# Patient Record
Sex: Male | Born: 1960 | Race: White | Hispanic: No | Marital: Single | Smoking: Never smoker
Health system: Southern US, Community
[De-identification: ages and names within clinical notes are randomized; demographics above are authoritative.]

## PROBLEM LIST (undated history)

## (undated) DIAGNOSIS — E119 Type 2 diabetes mellitus without complications: Secondary | ICD-10-CM

## (undated) DIAGNOSIS — I252 Old myocardial infarction: Secondary | ICD-10-CM

## (undated) DIAGNOSIS — I10 Essential (primary) hypertension: Secondary | ICD-10-CM

## (undated) DIAGNOSIS — M1990 Unspecified osteoarthritis, unspecified site: Secondary | ICD-10-CM

## (undated) DIAGNOSIS — M109 Gout, unspecified: Secondary | ICD-10-CM

## (undated) DIAGNOSIS — E039 Hypothyroidism, unspecified: Secondary | ICD-10-CM

## (undated) DIAGNOSIS — I4891 Unspecified atrial fibrillation: Secondary | ICD-10-CM

## (undated) DIAGNOSIS — E079 Disorder of thyroid, unspecified: Secondary | ICD-10-CM

## (undated) HISTORY — PX: CARDIAC CATHETERIZATION: SHX172

## (undated) HISTORY — PX: BACK SURGERY: SHX140

## (undated) HISTORY — PX: CATARACT EXTRACTION W/ INTRAOCULAR LENS IMPLANT: SHX1309

## (undated) HISTORY — PX: CORONARY ARTERY BYPASS GRAFT: SHX141

---

## 2010-07-04 ENCOUNTER — Ambulatory Visit: Payer: Self-pay | Admitting: Cardiovascular Disease

## 2010-07-04 ENCOUNTER — Observation Stay: Payer: Self-pay | Admitting: Internal Medicine

## 2011-09-30 ENCOUNTER — Inpatient Hospital Stay: Payer: Self-pay | Admitting: Internal Medicine

## 2014-03-05 ENCOUNTER — Ambulatory Visit: Payer: Self-pay | Admitting: Gastroenterology

## 2014-03-13 ENCOUNTER — Emergency Department: Payer: Self-pay | Admitting: Internal Medicine

## 2014-03-13 LAB — CBC
HCT: 42.6 %
HGB: 14.3 g/dL
MCH: 30.7 pg
MCHC: 33.5 g/dL
MCV: 92 fL
Platelet: 176 x10 3/mm 3
RBC: 4.65 x10 6/mm 3
RDW: 14.1 %
WBC: 6.1 x10 3/mm 3

## 2014-03-13 LAB — COMPREHENSIVE METABOLIC PANEL
AST: 59 U/L — AB (ref 15–37)
Albumin: 3.7 g/dL (ref 3.4–5.0)
Alkaline Phosphatase: 65 U/L
Anion Gap: 4 — ABNORMAL LOW (ref 7–16)
BILIRUBIN TOTAL: 0.4 mg/dL (ref 0.2–1.0)
BUN: 19 mg/dL — AB (ref 7–18)
CHLORIDE: 103 mmol/L (ref 98–107)
CREATININE: 0.94 mg/dL (ref 0.60–1.30)
Calcium, Total: 8.8 mg/dL (ref 8.5–10.1)
Co2: 30 mmol/L (ref 21–32)
EGFR (African American): >60
EGFR (Non-African Amer.): >60
GLUCOSE: 145 mg/dL — AB (ref 65–99)
Osmolality: 279 (ref 275–301)
Potassium: 3.7 mmol/L (ref 3.5–5.1)
SGPT (ALT): 64 U/L (ref 12–78)
Sodium: 137 mmol/L (ref 136–145)
TOTAL PROTEIN: 6.8 g/dL (ref 6.4–8.2)

## 2014-09-20 ENCOUNTER — Observation Stay: Payer: Self-pay | Admitting: Internal Medicine

## 2014-09-20 LAB — CBC WITH DIFFERENTIAL/PLATELET
BASOS ABS: 0.1 10*3/uL (ref 0.0–0.1)
BASOS PCT: 0.6 %
Eosinophil #: 0 10*3/uL (ref 0.0–0.7)
Eosinophil %: 0.3 %
HCT: 40.9 % (ref 40.0–52.0)
HGB: 13.7 g/dL (ref 13.0–18.0)
LYMPHS ABS: 0.9 10*3/uL — AB (ref 1.0–3.6)
Lymphocyte %: 10.8 %
MCH: 31.3 pg (ref 26.0–34.0)
MCHC: 33.5 g/dL (ref 32.0–36.0)
MCV: 94 fL (ref 80–100)
MONO ABS: 0.5 x10 3/mm (ref 0.2–1.0)
MONOS PCT: 6 %
Neutrophil #: 6.7 10*3/uL — ABNORMAL HIGH (ref 1.4–6.5)
Neutrophil %: 82.3 %
Platelet: 166 10*3/uL (ref 150–440)
RBC: 4.37 10*6/uL — AB (ref 4.40–5.90)
RDW: 13.3 % (ref 11.5–14.5)
WBC: 8.1 10*3/uL (ref 3.8–10.6)

## 2014-09-20 LAB — LIPASE, BLOOD: LIPASE: 177 U/L (ref 73–393)

## 2014-09-20 LAB — COMPREHENSIVE METABOLIC PANEL
ALK PHOS: 63 U/L
ALT: 99 U/L — AB
ANION GAP: 10 (ref 7–16)
AST: 93 U/L — AB (ref 15–37)
Albumin: 3.5 g/dL (ref 3.4–5.0)
BILIRUBIN TOTAL: 0.3 mg/dL (ref 0.2–1.0)
BUN: 17 mg/dL (ref 7–18)
CALCIUM: 8.7 mg/dL (ref 8.5–10.1)
Chloride: 103 mmol/L (ref 98–107)
Co2: 25 mmol/L (ref 21–32)
Creatinine: 1.21 mg/dL (ref 0.60–1.30)
EGFR (African American): >60
GLUCOSE: 311 mg/dL — AB (ref 65–99)
Osmolality: 289 (ref 275–301)
Potassium: 4.2 mmol/L (ref 3.5–5.1)
SODIUM: 138 mmol/L (ref 136–145)
Total Protein: 7 g/dL (ref 6.4–8.2)

## 2014-09-20 LAB — TROPONIN I
TROPONIN-I: 0.07 ng/mL — AB
TROPONIN-I: 0.08 ng/mL — AB
TROPONIN-I: 0.09 ng/mL — AB

## 2014-09-20 LAB — MAGNESIUM: MAGNESIUM: 1.3 mg/dL — AB

## 2014-09-20 LAB — PROTIME-INR
INR: 1
PROTHROMBIN TIME: 13 s (ref 11.5–14.7)

## 2014-09-20 LAB — APTT: Activated PTT: 28.8 secs (ref 23.6–35.9)

## 2014-09-21 LAB — URINALYSIS, COMPLETE
BACTERIA: NONE SEEN
Bilirubin,UR: NEGATIVE
Blood: NEGATIVE
Ketone: NEGATIVE
Leukocyte Esterase: NEGATIVE
Nitrite: NEGATIVE
PH: 5 (ref 4.5–8.0)
RBC,UR: <1 /HPF (ref 0–5)
SQUAMOUS EPITHELIAL: NONE SEEN
Specific Gravity: 1.022 (ref 1.003–1.030)
WBC UR: <1 /HPF (ref 0–5)

## 2014-09-21 LAB — COMPREHENSIVE METABOLIC PANEL
ALT: 81 U/L — AB
Albumin: 3.2 g/dL — ABNORMAL LOW (ref 3.4–5.0)
Alkaline Phosphatase: 52 U/L
Anion Gap: 9 (ref 7–16)
BILIRUBIN TOTAL: 0.2 mg/dL (ref 0.2–1.0)
BUN: 14 mg/dL (ref 7–18)
CALCIUM: 8.3 mg/dL — AB (ref 8.5–10.1)
CREATININE: 1.06 mg/dL (ref 0.60–1.30)
Chloride: 102 mmol/L (ref 98–107)
Co2: 28 mmol/L (ref 21–32)
EGFR (Non-African Amer.): >60
Glucose: 214 mg/dL — ABNORMAL HIGH (ref 65–99)
Osmolality: 284 (ref 275–301)
Potassium: 3.5 mmol/L (ref 3.5–5.1)
SGOT(AST): 64 U/L — ABNORMAL HIGH (ref 15–37)
SODIUM: 139 mmol/L (ref 136–145)
Total Protein: 5.9 g/dL — ABNORMAL LOW (ref 6.4–8.2)

## 2014-09-21 LAB — LIPID PANEL
CHOLESTEROL: 145 mg/dL (ref 0–200)
HDL Cholesterol: 26 mg/dL — ABNORMAL LOW (ref 40–60)
LDL CHOLESTEROL, CALC: 64 mg/dL (ref 0–100)
Triglycerides: 277 mg/dL — ABNORMAL HIGH (ref 0–200)
VLDL Cholesterol, Calc: 55 mg/dL — ABNORMAL HIGH (ref 5–40)

## 2014-09-21 LAB — TSH: Thyroid Stimulating Horm: 43 u[IU]/mL — ABNORMAL HIGH

## 2014-09-21 LAB — T4, FREE: Free Thyroxine: 0.85 ng/dL (ref 0.76–1.46)

## 2014-09-21 LAB — HEPARIN LEVEL (UNFRACTIONATED)
Anti-Xa(Unfractionated): 0.17 IU/mL — ABNORMAL LOW (ref 0.30–0.70)
Anti-Xa(Unfractionated): 0.38 IU/mL (ref 0.30–0.70)

## 2014-09-21 LAB — HEMOGLOBIN A1C: HEMOGLOBIN A1C: 9.5 % — AB (ref 4.2–6.3)

## 2014-09-21 LAB — MAGNESIUM: Magnesium: 1.3 mg/dL — ABNORMAL LOW

## 2015-01-30 ENCOUNTER — Emergency Department: Payer: Self-pay | Admitting: Emergency Medicine

## 2015-02-26 ENCOUNTER — Emergency Department
Admit: 2015-02-26 | Disposition: A | Discharge status: Home / Self Care | Payer: Self-pay | Admitting: Emergency Medicine

## 2015-02-26 LAB — CBC
HCT: 39.3 % — ABNORMAL LOW (ref 40.0–52.0)
HGB: 13.3 g/dL (ref 13.0–18.0)
MCH: 31.2 pg (ref 26.0–34.0)
MCHC: 33.7 g/dL (ref 32.0–36.0)
MCV: 93 fL (ref 80–100)
PLATELETS: 170 10*3/uL (ref 150–440)
RBC: 4.24 10*6/uL — AB (ref 4.40–5.90)
RDW: 13.3 % (ref 11.5–14.5)
WBC: 8.3 10*3/uL (ref 3.8–10.6)

## 2015-02-26 LAB — BASIC METABOLIC PANEL
Anion Gap: 9 (ref 7–16)
BUN: 33 mg/dL — ABNORMAL HIGH
CHLORIDE: 99 mmol/L — AB
Calcium, Total: 9 mg/dL
Co2: 26 mmol/L
Creatinine: 1.26 mg/dL — ABNORMAL HIGH
EGFR (African American): >60
EGFR (Non-African Amer.): >60
GLUCOSE: 314 mg/dL — AB
Potassium: 3.9 mmol/L
SODIUM: 134 mmol/L — AB

## 2015-02-26 LAB — TROPONIN I: TROPONIN-I: 0.07 ng/mL — AB

## 2015-02-26 LAB — T4, FREE: Free Thyroxine: 1.02 ng/dL

## 2015-02-27 LAB — TROPONIN I: Troponin-I: 0.06 ng/mL — ABNORMAL HIGH

## 2015-03-18 NOTE — Consult Note (Signed)
PATIENT NAME:  Ronald Whitaker, Kyce A MR#:  [id number] DATE OF BIRTH:  01-19-1961  DATE OF CONSULTATION:  03/05/2014  CONSULTING PHYSICIAN:  Dow AdolphMatthew Deitrich Steve, MD  REFERRING PHYSICIAN:  [facility]  REASON FOR CONSULT: Food impaction in esophagus.  HISTORY OF PRESENT ILLNESS: Ronald Whitaker is a 54 year old male with a past medical history notable for coronary artery disease status post CABG, TIA, hypothyroidism, gout, GERD, who presented to the Emergency Room for a little less than 24 hours of a piece of pork being stuck in his esophagus. He reports that he has had several food impactions in the past, but they all resolved on their own. He has never had an upper endoscopy before. Currently he is unable to swallow any liquids or solids, and is having a hard time with his saliva. He has the sensation that the pork has been stuck there since 7:30 last night.   Other than this, he is not having any trouble such as weight loss, diarrhea, rectal bleeding, black bowel movements. He reports that there is a family history of esophageal food impactions. He is currently taking Prilosec, which is controlling his acid reflux well.   PAST MEDICAL HISTORY: 1.  Coronary artery disease, status post CABG.  2.  Hypothyroid.  3.  Hyperlipidemia.  4.  TIA.   5.  GERD.  6.  Gout.  7.  Diverticulitis.  8.  Rhabdomyolysis.   MEDICATIONS: Include Prilosec, aspirin 81, lisinopril. He does not recall his other medicines.   FAMILY HISTORY: No family history of GI malignancy, esophageal cancer, stomach cancer, colon cancer.   SOCIAL HISTORY: He denies any tobacco or any alcohol.  REVIEW OF SYSTEMS: A 10-system review is conducted and is negative, except as stated in the HPI.    PHYSICAL EXAMINATION: GENERAL: Alert and oriented times 4.  No acute distress. Appears stated age. HEENT: Normocephalic/atraumatic. Extraocular movements are intact. Anicteric. NECK: Soft, supple. JVP appears normal. No  adenopathy. CHEST: Clear to auscultation. No wheeze or crackle. Respirations unlabored. HEART: Regular. No murmur, rub, or gallop.  Normal S1 and S2. ABDOMEN: Soft, nontender, nondistended.  Normal active bowel sounds in all four quadrants.  No organomegaly. No masses EXTREMITIES: No swelling, well perfused. SKIN: No rash or lesion. Skin color, texture, turgor normal. NEUROLOGICAL: Grossly intact. PSYCHIATRIC: Normal tone and affect. MUSCULOSKELETAL: No joint swelling or erythema.   ASSESSMENT AND PLAN: Esophageal food bolus: It does sound as though his symptoms are consistent with an ongoing esophageal food bolus. Therefore, it will be necessary to perform an upper endoscopy to prevent necrosis of the esophagus and also for symptomatic relief.   RECOMMENDATIONS: We will perform upper endoscopy at this time with Anesthesia assistance with intubation for food bolus removal. Further recommendations will be pending findings on upper endoscopy. He likely will need a repeat upper endoscopy in several weeks for investigation of the cause and with potential biopsies versus treatment. In addition, I will likely recommend increase in his Prilosec to 40 mg b.i.d., but that will be pending the findings on upper endoscopy.    Thank you for this consult.    ____________________________ Dow AdolphMatthew Latayvia Mandujano, MD mr:mr D: 03/05/2014 18:01:34 ET T: 03/05/2014 18:11:12 ET JOB#: [id number]  cc: Dow AdolphMatthew Madysun Thall, MD, <Dictator> Kathalene FramesMATTHEW G Toniyah Dilmore MD ELECTRONICALLY SIGNED 03/22/2014 17:03

## 2015-03-18 NOTE — Discharge Summary (Signed)
PATIENT NAME:  Ronald Whitaker, Ronald Whitaker MR#:  [id number] DATE OF BIRTH:  02-11-1961  ADMITTING PHYSICIAN: Shaune PollackQing Chen, MD  DISCHARGING PHYSICIAN: Enid Baasadhika Gladie Gravette, MD  PRIMARY CARE PHYSICIAN: Nonlocal.   [facility].  CONSULTATIONS IN THE HOSPITAL  Cardiology consultation with Dwayne D. Callwood, MD.  DISCHARGE DIAGNOSES:  1.  New onset atrial fibrillation.  2.  Hypothyroidism. 3.  Coronary artery disease, status post bypass graft surgery. 4.  Hypertension.  5.  Gastroesophageal reflux disease. 6.  Diverticulosis. 7.  History of transient ischemic attack. 8.  Fatty liver. 9.  Undiagnosed diabetes mellitus.  DISCHARGE HOME MEDICATIONS: 1.  Benazepril 20 mg p.o. daily.  2.  Lipitor 40 mg p.o. at bedtime.  3.  Sublingual nitroglycerin q. 5 minutes p.r.n. for chest pain.  4.  Tylenol 325 mg 1 tablet q. 4-6 hours p.r.n. for pain.  5.  Prilosec 40 mg p.o. daily.  6.  Aspirin 81 mg p.o. daily.  7.  Multivitamin 1 tablet p.o. daily.  8.  Polyunsaturated fatty acids 1 gram capsule 3 capsules once Whitaker day.  9.  Cialis 5 mg daily p.r.n.  10.  Synthroid 100 mcg p.o. daily.  11.  Norvasc 5 mg p.o. daily.  12.  Metoprolol 50 mg p.o. b.i.d.  13.  Eliquis 5 mg p.o. b.i.d.    DISCHARGE HOME OXYGEN:  None.  DISCHARGE DIET: Low-sodium diet.   DISCHARGE ACTIVITY: As tolerated.  FOLLOWUP INSTRUCTIONS:   1.  Cardiology followup in 2 weeks.  2.  PCP followup in 1 week.  3.  TSH and T4 levels checked by PCP in 4-6 weeks.  LABORATORY DATA AND IMAGING STUDIES: Prior to discharge, LDL cholesterol 64, HDL 26, total cholesterol 045145, triglycerides 277,  magnesium 1.3, hemoglobin A1c 9.5. Sodium 139, potassium 3.5, chloride 102, bicarbonate 28, BUN 14, creatinine 1.06, glucose 214, calcium of 8.3.  Ultrasound of the abdomen showing fatty liver.   ALT 81, AST 64, alkaline phosphatase 52, total bilirubin 0.2, albumin of 3.2. Free T4 is 0.85. TSH was elevated at 43. WBC 8.1,  hemoglobin 13.7, hematocrit 40.9, platelet count 166,000.   Chest x-ray showing clear lungs. No acute cardiopulmonary disease. Urinalysis negative for any infection.   BRIEF HOSPITAL COURSE: Ronald Whitaker is Whitaker 54 year old gentleman with history of hypertension, hyperlipidemia, coronary artery disease, status post bypass graft surgery, who presents secondary to palpitations and noted to have new onset atrial fibrillation.  1.  New onset atrial fibrillation. No prior history other than transient atrial fibrillation after his bypass graft surgery. He is already on metoprolol. Dose has been increased to 50 mg b.i.d., was started on heparin drip.  CHADS score is 2.  Seen by cardiologist, Dr. Juliann Paresallwood, who recommended anticoagulation because the patient does not have any risk factors. He is being discharged on Eliquis at this point. He has converted back to normal sinus rhythm after admission and has remained in normal sinus rhythm. No indication of any antiarrhythmias  according to cardiology. 2.  Hypothyroidism. TSH is extremely high, T4 though is in normal limits, but Synthroid dose is being increased to 100 mcg due to patient's symptoms of hypothyroidism, with fatigue and cold intolerance.  3.  Coronary artery disease, status post surgery bypass graft surgery, stable at this time. [facility]. Continue her aspirin, Lipitor, benazepril, and metoprolol at this time.  4.  Hypertension, on amlodipine. Dose decreased to 5 mg,  on metoprolol and also on benazepril. 5.  His course has been  otherwise uneventful in the hospital.   DISCHARGE CONDITION: Stable.   DISCHARGE DISPOSITION: Home.   TIME SPENT ON DISCHARGE: 45 minutes.   ____________________________ Enid Baas, MD rk:LT D: 09/21/2014 13:15:28 ET T: 09/21/2014 21:30:03 ET JOB#: [id number]  cc: Enid Baas, MD, <Dictator> [facility] Enid Baas MD ELECTRONICALLY SIGNED 09/27/2014 10:55

## 2015-09-23 IMAGING — CR DG CHEST 1V PORT
1 series · 1 of 1 positions shown · non-contrast
Comparison: 01/30/2015

CLINICAL DATA: Atrial fibrillation.

EXAM:
PORTABLE CHEST - 1 VIEW

[ap]
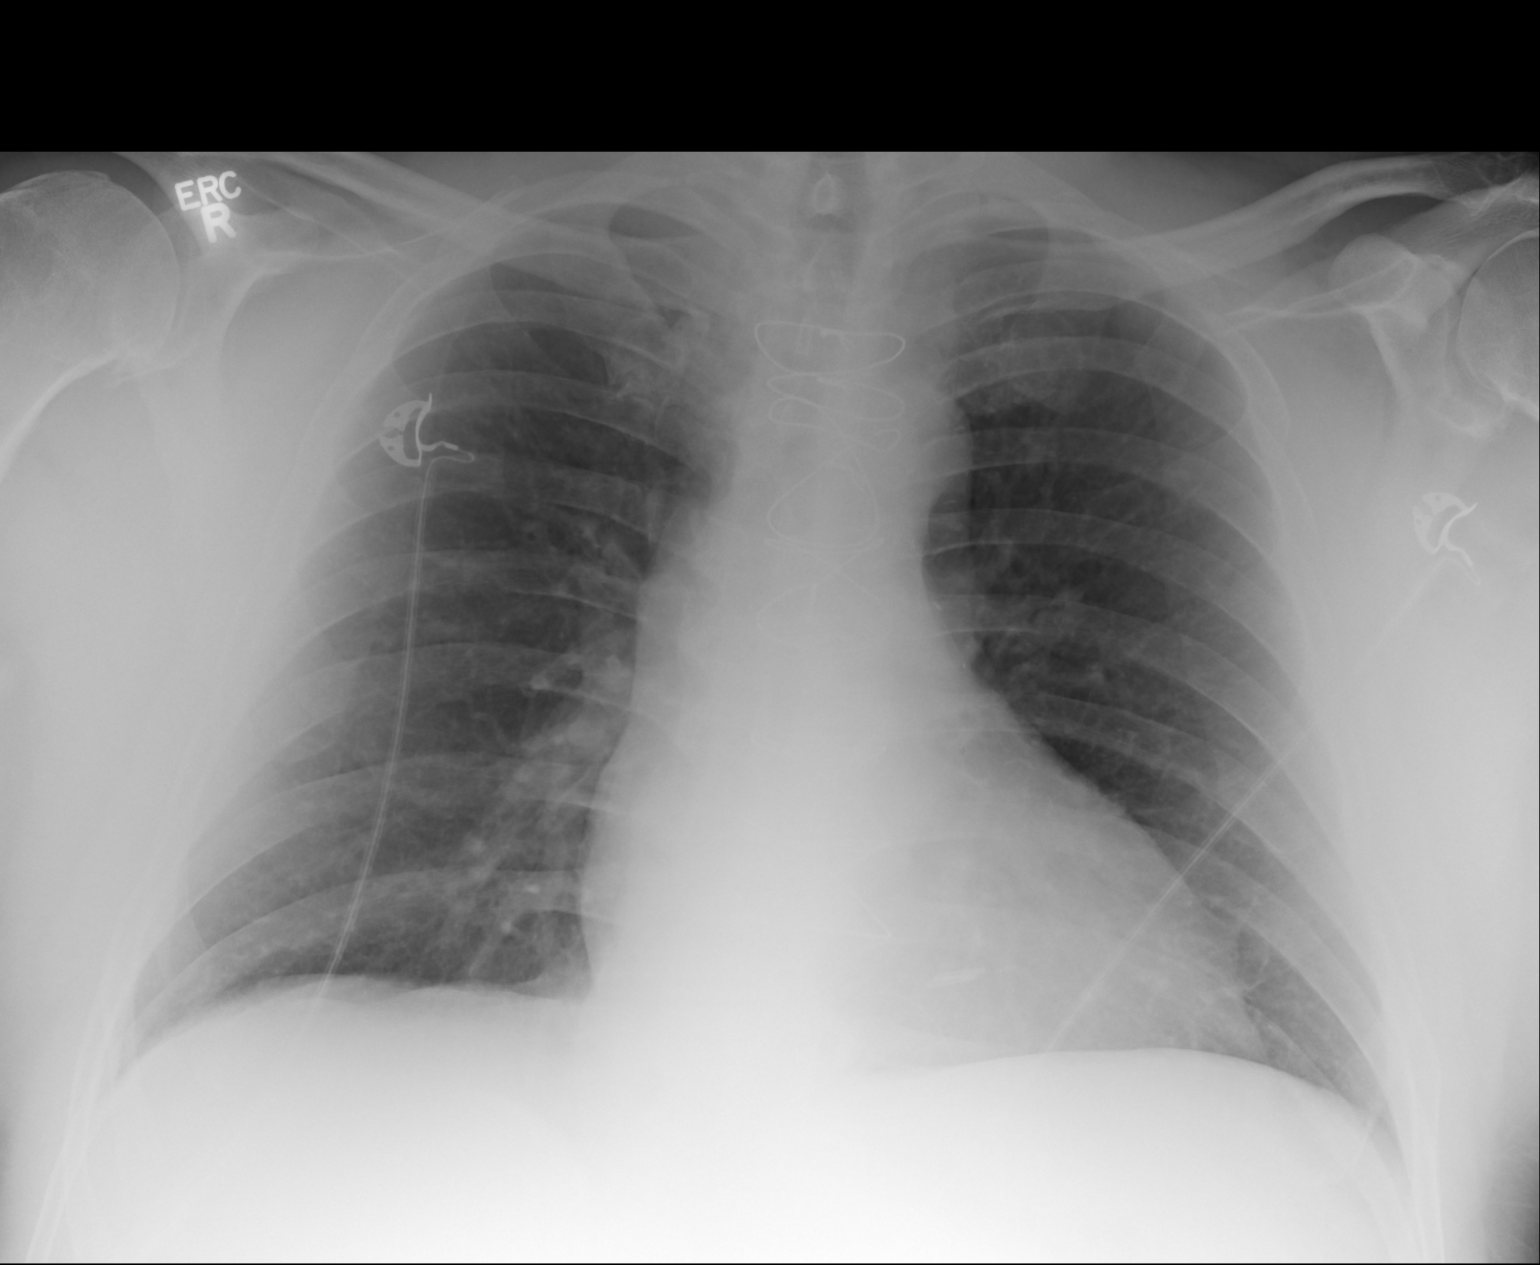

[1 of 1 positions shown; findings below may reference images not displayed]

FINDINGS: No cardiomegaly for technique. Noted previous sternotomy. Stable
moderate aortic tortuosity. The hila are negative.

Mild hyperinflation. There is no edema, consolidation, effusion, or
pneumothorax.
IMPRESSION: No active disease.

## 2015-12-18 ENCOUNTER — Encounter: Payer: Self-pay | Admitting: Emergency Medicine

## 2015-12-18 ENCOUNTER — Emergency Department
Admission: EM | Admit: 2015-12-18 | Discharge: 2015-12-18 | Disposition: A | Discharge status: Home / Self Care | Payer: BLUE CROSS/BLUE SHIELD | Attending: Emergency Medicine | Admitting: Emergency Medicine

## 2015-12-18 ENCOUNTER — Emergency Department: Payer: BLUE CROSS/BLUE SHIELD

## 2015-12-18 DIAGNOSIS — X58XXXA Exposure to other specified factors, initial encounter: Secondary | ICD-10-CM | POA: Diagnosis not present

## 2015-12-18 DIAGNOSIS — Y9389 Activity, other specified: Secondary | ICD-10-CM | POA: Diagnosis not present

## 2015-12-18 DIAGNOSIS — S86111A Strain of other muscle(s) and tendon(s) of posterior muscle group at lower leg level, right leg, initial encounter: Secondary | ICD-10-CM

## 2015-12-18 DIAGNOSIS — I10 Essential (primary) hypertension: Secondary | ICD-10-CM | POA: Insufficient documentation

## 2015-12-18 DIAGNOSIS — Y9289 Other specified places as the place of occurrence of the external cause: Secondary | ICD-10-CM | POA: Insufficient documentation

## 2015-12-18 DIAGNOSIS — S86811A Strain of other muscle(s) and tendon(s) at lower leg level, right leg, initial encounter: Secondary | ICD-10-CM | POA: Diagnosis not present

## 2015-12-18 DIAGNOSIS — M79661 Pain in right lower leg: Secondary | ICD-10-CM

## 2015-12-18 DIAGNOSIS — S8991XA Unspecified injury of right lower leg, initial encounter: Secondary | ICD-10-CM | POA: Diagnosis present

## 2015-12-18 DIAGNOSIS — Y998 Other external cause status: Secondary | ICD-10-CM | POA: Diagnosis not present

## 2015-12-18 HISTORY — DX: Disorder of thyroid, unspecified: E07.9

## 2015-12-18 HISTORY — DX: Gout, unspecified: M10.9

## 2015-12-18 HISTORY — DX: Unspecified atrial fibrillation: I48.91

## 2015-12-18 HISTORY — DX: Essential (primary) hypertension: I10
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
## 2015-12-18 NOTE — Discharge Instructions (Signed)

## 2016-02-07 ENCOUNTER — Emergency Department: Payer: BLUE CROSS/BLUE SHIELD

## 2016-02-07 ENCOUNTER — Emergency Department
Admission: EM | Admit: 2016-02-07 | Discharge: 2016-02-07 | Disposition: A | Discharge status: Home / Self Care | Payer: BLUE CROSS/BLUE SHIELD | Attending: Emergency Medicine | Admitting: Emergency Medicine

## 2016-02-07 DIAGNOSIS — K5790 Diverticulosis of intestine, part unspecified, without perforation or abscess without bleeding: Secondary | ICD-10-CM | POA: Diagnosis not present

## 2016-02-07 DIAGNOSIS — Z951 Presence of aortocoronary bypass graft: Secondary | ICD-10-CM | POA: Diagnosis not present

## 2016-02-07 DIAGNOSIS — Z79899 Other long term (current) drug therapy: Secondary | ICD-10-CM | POA: Diagnosis not present

## 2016-02-07 DIAGNOSIS — R109 Unspecified abdominal pain: Secondary | ICD-10-CM

## 2016-02-07 DIAGNOSIS — I482 Chronic atrial fibrillation: Secondary | ICD-10-CM | POA: Diagnosis not present

## 2016-02-07 DIAGNOSIS — R079 Chest pain, unspecified: Secondary | ICD-10-CM

## 2016-02-07 DIAGNOSIS — I10 Essential (primary) hypertension: Secondary | ICD-10-CM | POA: Insufficient documentation

## 2016-02-07 DIAGNOSIS — R1031 Right lower quadrant pain: Secondary | ICD-10-CM | POA: Insufficient documentation

## 2016-02-07 DIAGNOSIS — Z7984 Long term (current) use of oral hypoglycemic drugs: Secondary | ICD-10-CM | POA: Diagnosis not present

## 2016-02-07 DIAGNOSIS — Z7982 Long term (current) use of aspirin: Secondary | ICD-10-CM | POA: Insufficient documentation

## 2016-02-07 DIAGNOSIS — E119 Type 2 diabetes mellitus without complications: Secondary | ICD-10-CM | POA: Insufficient documentation

## 2016-02-07 HISTORY — DX: Type 2 diabetes mellitus without complications: E11.9

## 2016-02-07 LAB — BASIC METABOLIC PANEL
Anion gap: 9 (ref 5–15)
BUN: 18 mg/dL (ref 6–20)
CALCIUM: 9.5 mg/dL (ref 8.9–10.3)
CO2: 26 mmol/L (ref 22–32)
CREATININE: 0.93 mg/dL (ref 0.61–1.24)
Chloride: 98 mmol/L — ABNORMAL LOW (ref 101–111)
GFR calc Af Amer: >60 mL/min (ref >60)
GFR calc non Af Amer: >60 mL/min (ref >60)
Glucose, Bld: 221 mg/dL — ABNORMAL HIGH (ref 65–99)
POTASSIUM: 4.4 mmol/L (ref 3.5–5.1)
SODIUM: 133 mmol/L — AB (ref 135–145)

## 2016-02-07 LAB — URINALYSIS COMPLETE WITH MICROSCOPIC (ARMC ONLY)
BACTERIA UA: NONE SEEN
BILIRUBIN URINE: NEGATIVE
Glucose, UA: 150 mg/dL — AB
KETONES UR: NEGATIVE mg/dL
LEUKOCYTES UA: NEGATIVE
Nitrite: NEGATIVE
PH: 5 (ref 5.0–8.0)
Specific Gravity, Urine: >1.06 — ABNORMAL HIGH (ref 1.005–1.030)
WBC, UA: NONE SEEN WBC/hpf (ref 0–5)

## 2016-02-07 LAB — CBC
HCT: 50.2 % (ref 40.0–52.0)
Hemoglobin: 17.1 g/dL (ref 13.0–18.0)
MCH: 30.5 pg (ref 26.0–34.0)
MCHC: 34.2 g/dL (ref 32.0–36.0)
MCV: 89.3 fL (ref 80.0–100.0)
PLATELETS: 198 10*3/uL (ref 150–440)
RBC: 5.62 MIL/uL (ref 4.40–5.90)
RDW: 13.6 % (ref 11.5–14.5)
WBC: 11.2 10*3/uL — ABNORMAL HIGH (ref 3.8–10.6)

## 2016-02-07 LAB — TROPONIN I: Troponin I: <0.03 ng/mL (ref <0.031)

## 2016-02-07 MED ORDER — IOHEXOL 350 MG/ML SOLN
100.0000 mL | Freq: Once | INTRAVENOUS | Status: AC | PRN
  Administered 2016-02-07: 100 mL via INTRAVENOUS

## 2016-02-07 MED ORDER — HYDROMORPHONE HCL 1 MG/ML IJ SOLN
0.5000 mg | Freq: Once | INTRAMUSCULAR | Status: AC
  Administered 2016-02-07: 0.5 mg via INTRAVENOUS
  Filled 2016-02-07: qty 1

## 2016-02-07 MED ORDER — HYDROMORPHONE HCL 1 MG/ML IJ SOLN
1.0000 mg | Freq: Once | INTRAMUSCULAR | Status: AC
  Administered 2016-02-07: 1 mg via INTRAVENOUS
  Filled 2016-02-07: qty 1

## 2016-02-07 MED ORDER — ONDANSETRON HCL 4 MG/2ML IJ SOLN
4.0000 mg | Freq: Once | INTRAMUSCULAR | Status: AC
  Administered 2016-02-07: 4 mg via INTRAVENOUS
  Filled 2016-02-07: qty 2

## 2016-02-07 MED ORDER — TRAMADOL HCL 50 MG PO TABS: 50.0000 mg | ORAL_TABLET | Freq: Four times a day (QID) | ORAL | Status: DC | PRN

## 2016-02-07 MED ORDER — NITROGLYCERIN 0.4 MG SL SUBL
0.4000 mg | SUBLINGUAL_TABLET | SUBLINGUAL | Status: DC | PRN
  Filled 2016-02-07: qty 1

## 2016-02-07 NOTE — ED Notes (Signed)
Pt reports he is unable to urinate at this time; notified pt to let RN know when he needs to use restroom so that we can collect specimen.

## 2016-02-07 NOTE — ED Provider Notes (Signed)
-----------------------------------------   5:12 PM on 02/07/2016 -----------------------------------------  Care was assumed from Dr. Huel CoteQuigley at 4 PM pending second troponin and urinalysis. Second troponin is negative. Urinalysis is not consistent with infection. The patient is resting comfortably at this time and has no complaints. DC with return precautions and close PCP follow-up. He and his wife at bedside are comfortable with the discharge plan.  Gayla DossEryka A Orvile Corona, MD 02/07/16 [phone]

## 2016-02-07 NOTE — ED Notes (Signed)
Pt O2 sats intermittently dropping into 80's after 1mg  dilaudid given; pt arousable at this time but drifting off to sleep frequently.  Pt placed on 2L nasal cannla; pt O2 sat 97% at this time.

## 2016-02-07 NOTE — ED Notes (Signed)
Pt reports "severe" abdominal pain that started this morning.  Also having chest pain that radiates to shoulders.  Reports nausea.

## 2016-02-07 NOTE — Discharge Instructions (Signed)
Abdominal Pain, Adult °Many things can cause abdominal pain. Usually, abdominal pain is not caused by a disease and will improve without treatment. It can often be observed and treated at home. Your health care provider will do a physical exam and possibly order blood tests and X-rays to help determine the seriousness of your pain. However, in many cases, more time must pass before a clear cause of the pain can be found. Before that point, your health care provider may not know if you need more testing or further treatment. °HOME CARE INSTRUCTIONS °Monitor your abdominal pain for any changes. The following actions may help to alleviate any discomfort you are experiencing: °· Only take over-the-counter or prescription medicines as directed by your health care provider. °· Do not take laxatives unless directed to do so by your health care provider. °· Try a clear liquid diet (broth, tea, or water) as directed by your health care provider. Slowly move to a bland diet as tolerated. °SEEK MEDICAL CARE IF: °· You have unexplained abdominal pain. °· You have abdominal pain associated with nausea or diarrhea. °· You have pain when you urinate or have a bowel movement. °· You experience abdominal pain that wakes you in the night. °· You have abdominal pain that is worsened or improved by eating food. °· You have abdominal pain that is worsened with eating fatty foods. °· You have a fever. °SEEK IMMEDIATE MEDICAL CARE IF: °· Your pain does not go away within 2 hours. °· You keep throwing up (vomiting). °· Your pain is felt only in portions of the abdomen, such as the right side or the left lower portion of the abdomen. °· You pass bloody or black tarry stools. °MAKE SURE YOU: °· Understand these instructions. °· Will watch your condition. °· Will get help right away if you are not doing well or get worse. °  °This information is not intended to replace advice given to you by your health care provider. Make sure you discuss  any questions you have with your health care provider. °  °Document Released: 08/21/2005 Document Revised: 08/02/2015 Document Reviewed: 07/21/2013 °Elsevier Interactive Patient Education ©2016 [employer] ° ° °Please return immediately if condition worsens. Please contact her primary physician or the physician you were given for referral. If you have any specialist physicians involved in her treatment and plan please also contact them. Thank you for using [facility]. ° °

## 2016-02-07 NOTE — ED Provider Notes (Signed)
Time Seen: Approximately 12:30 I have reviewed the triage notes  Chief Complaint: Chest Pain; Hypertension; and Abdominal Pain   History of Present Illness: Ronald Whitaker is a 55 y.o. male who has a known history of chronic intermittent atrial fibrillation is currently on amiodarone. He also takes Eliquis. The patient arrives in atrial fibrillation and is presenting with acute onset of chest discomfort and diffuse right-sided abdominal pain. His wife states that occasionally be exacerbations of pain will aggravate his atrial fibrillation. He does describe some chest tightness and arrives with complaints of mainly. Umbilical toward right-sided abdominal pain. He states it seemed to start relatively acutely today. The pain just started prior to arrival. He states he has some nausea with no persistent vomiting. He denies any loose stool or diarrhea he states the chest pain radiates to both his shoulders.   Past Medical History  Diagnosis Date  . Hypertension   . Atrial fibrillation (HCC)   . Gout   . Thyroid disease     hypothyroid  . Diabetes mellitus without complication (HCC)     There are no active problems to display for this patient.   Past Surgical History  Procedure Laterality Date  . Coronary artery bypass graft      2014    Past Surgical History  Procedure Laterality Date  . Coronary artery bypass graft      2014    Current Outpatient Rx  Name  Route  Sig  Dispense  Refill  . acetaminophen (TYLENOL) 500 MG tablet   Oral   Take 1,000 mg by mouth every 6 (six) hours as needed for mild pain or headache.         . allopurinol (ZYLOPRIM) 300 MG tablet   Oral   Take 300 mg by mouth daily.         Marland Kitchen amiodarone (PACERONE) 200 MG tablet   Oral   Take 200 mg by mouth daily.         Marland Kitchen amLODipine (NORVASC) 10 MG tablet   Oral   Take 10 mg by mouth daily.         Marland Kitchen apixaban (ELIQUIS) 5 MG TABS tablet   Oral   Take 5 mg by mouth 2 (two) times daily.          Marland Kitchen aspirin EC 81 MG tablet   Oral   Take 81 mg by mouth daily.         Marland Kitchen atorvastatin (LIPITOR) 40 MG tablet   Oral   Take 40 mg by mouth at bedtime.         . benazepril (LOTENSIN) 20 MG tablet   Oral   Take 20 mg by mouth daily.         Marland Kitchen dicyclomine (BENTYL) 10 MG capsule   Oral   Take 10 mg by mouth 4 (four) times daily as needed (for abdominal pain).         . dorzolamide-timolol (COSOPT) 22.3-6.8 MG/ML ophthalmic solution   Both Eyes   Place 1 drop into both eyes 2 (two) times daily.         Marland Kitchen latanoprost (XALATAN) 0.005 % ophthalmic solution   Both Eyes   Place 1 drop into both eyes at bedtime.         Marland Kitchen levothyroxine (SYNTHROID, LEVOTHROID) 175 MCG tablet   Oral   Take 175 mcg by mouth daily before breakfast.         . LORazepam (ATIVAN) 0.5 MG  tablet   Oral   Take 0.5 mg by mouth daily as needed for anxiety.         . metFORMIN (GLUCOPHAGE) 500 MG tablet   Oral   Take 1,000 mg by mouth 2 (two) times daily with a meal.         . metoprolol (LOPRESSOR) 50 MG tablet   Oral   Take 50 mg by mouth 2 (two) times daily.         . Multiple Vitamin (MULTIVITAMIN WITH MINERALS) TABS tablet   Oral   Take 1 tablet by mouth daily.         . pantoprazole (PROTONIX) 40 MG tablet   Oral   Take 40 mg by mouth daily.           Allergies:  Colchicine  Family History: No family history on file.  Social History: Social History  Substance Use Topics  . Smoking status: Never Smoker   . Smokeless tobacco: Never Used  . Alcohol Use: No     Review of Systems:   10 point review of systems was performed and was otherwise negative:  Constitutional: No fever Eyes: No visual disturbances ENT: No sore throat, ear pain Cardiac: Chest pain is described as some chest tightness that started roughly the same time as the abdominal pain. Respiratory: No shortness of breath, wheezing, or stridor Abdomen:Pain is diffuse periumbilical slightly toward  the right lower quadrant Endocrine: No weight loss, No night sweats Extremities: No peripheral edema, cyanosis Skin: No rashes, easy bruising Neurologic: No focal weakness, trouble with speech or swollowing Urologic: No dysuria, Hematuria, or urinary frequency   Physical Exam:  ED Triage Vitals  Enc Vitals Group     BP 02/07/16 1222 172/155 mmHg     Pulse Rate 02/07/16 1222 126     Resp 02/07/16 1222 17     Temp --      Temp src --      SpO2 02/07/16 1222 96 %     Weight --      Height --      Head Cir --      Peak Flow --      Pain Score 02/07/16 1216 7     Pain Loc --      Pain Edu? --      Excl. in GC? --     General: Awake , Alert , and Oriented times 3; GCS 15Very anxious and appears uncomfortable Head: Normal cephalic , atraumatic Eyes: Pupils equal , round, reactive to light Nose/Throat: No nasal drainage, patent upper airway without erythema or exudate.  Neck: Supple, Full range of motion, No anterior adenopathy or palpable thyroid masses Lungs: Clear to ascultation without wheezes , rhonchi, or rales Heart: Irregular rate, irregular rhythm without murmurs, gallops , or rubs Abdomen: Diffuse tenderness without any palpable masses. Tenderness may be worse toward her right lower quadrant without any obvious peritoneal signs. No focal tenderness over McBurney's point, negative Murphy's sign     Extremities: 2 plus symmetric pulses. No edema, clubbing or cyanosis Neurologic: normal ambulation, Motor symmetric without deficits, sensory intact Skin: warm, dry, no rashes   Labs:   All laboratory work was reviewed including any pertinent negatives or positives listed below:  Labs Reviewed  BASIC METABOLIC PANEL - Abnormal; Notable for the following:    Sodium 133 (*)    Chloride 98 (*)    Glucose, Bld 221 (*)    All other components within normal limits  CBC - Abnormal; Notable for the following:    WBC 11.2 (*)    All other components within normal limits   URINALYSIS COMPLETEWITH MICROSCOPIC ([facility] ONLY) - Abnormal; Notable for the following:    Color, Urine YELLOW (*)    APPearance CLEAR (*)    Glucose, UA 150 (*)    Specific Gravity, Urine >1.060 (*)    Hgb urine dipstick 1+ (*)    Protein, ur >500 (*)    Squamous Epithelial / LPF 0-5 (*)    All other components within normal limits  TROPONIN I  TROPONIN I  Patient had 1 troponin study that was negative. Repeat troponin is pending  EKG: * ED ECG REPORT I, Jennye Moccasin, the attending physician, personally viewed and interpreted this ECG.  Date: 02/07/2016 EKG Time: 1218 Rate: 90 Rhythm: Atrial fibrillation with ST changes likely rate dependent occasional PVCs QRS Axis: normal Intervals: normal ST/T Wave abnormalities: Nonspecific ST changes Conduction Disturbances: none Narrative Interpretation: unremarkable No acute ischemic changes noted   Radiology: *   EXAM: CT ABDOMEN AND PELVIS WITHOUT CONTRAST  TECHNIQUE: Multidetector CT imaging of the abdomen and pelvis was performed following the standard protocol without IV contrast.  COMPARISON: None.  FINDINGS: Lung bases are free of acute infiltrate or sizable effusion.  The liver, gallbladder, spleen, adrenal glands and pancreas are within normal limits. The kidneys are well visualized bilaterally and reveal contrast material within the collecting systems from recent CT examination. No information about nonobstructing calculi can be gleaned from this exam. No obstructive changes are noted. The bladder is well distended with opacified and non-opacified urine.  The appendix is filled with air and within normal limits. Diffuse diverticular changes noted within the colon. No definitive diverticulitis is seen. No free pelvic fluid or pelvic mass lesion is noted. Aortoiliac calcifications are seen without aneurysmal dilatation. Degenerative changes of the lumbar spine are seen. No acute bony abnormality is noted.  Small fat containing umbilical hernia is noted. This measures approximately 1 cm  IMPRESSION: No acute abnormality noted.  Diverticulosis without diverticulitis.  Tiny fat containing umbilical hernia.   Electronically Signed By: Alcide Clever M.D. On: 02/07/2016 15:47          CT ANGIO CHEST AORTA W/CM &/OR WO/CM (Final result) Result time: 02/07/16 13:41:10   Procedure changed from CT Chest W Contrast      Final result by Rad Results In Interface (02/07/16 13:41:10)   Narrative:   CLINICAL DATA: Chest pain  EXAM: CT ANGIOGRAPHY CHEST WITH CONTRAST  TECHNIQUE: Multidetector CT imaging of the chest was performed using the standard protocol during bolus administration of intravenous contrast. Multiplanar CT image reconstructions and MIPs were obtained to evaluate the vascular anatomy.  CONTRAST: OMNIPAQUE IOHEXOL 350 MG/ML SOLN  COMPARISON: 02/26/2015  FINDINGS: The lungs are well aerated bilaterally. No focal infiltrate or sizable effusion is seen.  The thoracic inlet is within normal limits. The thoracic aorta demonstrates some mild calcific changes consistent with atherosclerotic disease. Patent coronary bypass grafts are seen. The pulmonary artery demonstrates a normal branching pattern. No findings to suggest pulmonary embolism are seen. No hilar or mediastinal adenopathy is noted.  The visualized upper abdomen reveals no acute abnormality. Degenerative changes of the thoracic spine are noted.  Review of the MIP images confirms the above findings.  IMPRESSION: No evidence pulmonary emboli.  Status post coronary bypass grafting.  No other focal abnormality is seen.   Electronically Signed  I personally reviewed the radiologic studies     ED Course: * Patient's stay here showed some symptomatic improvement after IV Dilaudid. He now is in a continuous normal sinus rhythm per the monitor. He has no feelings of chest  tightness etc. She'll laboratory work and evaluation does not show any acute ischemic changes and a troponin is negative. The patient still has some mild abdominal pain on exam without peritoneal signs. His CAT scan does not show any focal abnormalities and review of his history especially with his wife at the bedside shows he does have a history of "" colon spasms "". Have some Bentyl at home and she states that normally helps with the discomfort. I felt this was unlikely to be an acute surgical issue at this time. At the time of dictation we are still awaiting a repeat troponin along with a urine sample. I felt given the current presentation is was unlikely to be mesenteric ischemia.   Assessment:  Acute unspecified chest pain Chronic intermittent atrial fibrillation Acute exacerbation of chronic intermittent abdominal pain      Plan:  Patient's care will be turned over to my colleague to follow results of the troponin and urinalysis. I'll. The patient for discharge if all his other lab studies are negative.            Jennye Moccasin, MD 02/07/16 [phone]

## 2016-02-07 NOTE — ED Notes (Signed)
Patient transported to CT 

## 2016-02-07 NOTE — ED Notes (Signed)
Pt back on room air at this time; O2 sat 95%

## 2016-07-27 ENCOUNTER — Encounter: Payer: Self-pay | Admitting: Emergency Medicine

## 2016-07-27 ENCOUNTER — Emergency Department
Admission: EM | Admit: 2016-07-27 | Discharge: 2016-07-27 | Disposition: A | Discharge status: Home / Self Care | Payer: BLUE CROSS/BLUE SHIELD | Attending: Emergency Medicine | Admitting: Emergency Medicine

## 2016-07-27 DIAGNOSIS — K5732 Diverticulitis of large intestine without perforation or abscess without bleeding: Secondary | ICD-10-CM | POA: Diagnosis not present

## 2016-07-27 DIAGNOSIS — K5792 Diverticulitis of intestine, part unspecified, without perforation or abscess without bleeding: Secondary | ICD-10-CM

## 2016-07-27 DIAGNOSIS — E119 Type 2 diabetes mellitus without complications: Secondary | ICD-10-CM | POA: Diagnosis not present

## 2016-07-27 DIAGNOSIS — I4891 Unspecified atrial fibrillation: Secondary | ICD-10-CM | POA: Diagnosis not present

## 2016-07-27 DIAGNOSIS — E039 Hypothyroidism, unspecified: Secondary | ICD-10-CM | POA: Diagnosis not present

## 2016-07-27 DIAGNOSIS — Z7982 Long term (current) use of aspirin: Secondary | ICD-10-CM | POA: Insufficient documentation

## 2016-07-27 DIAGNOSIS — R1032 Left lower quadrant pain: Secondary | ICD-10-CM | POA: Diagnosis present

## 2016-07-27 DIAGNOSIS — I10 Essential (primary) hypertension: Secondary | ICD-10-CM | POA: Insufficient documentation

## 2016-07-27 DIAGNOSIS — I252 Old myocardial infarction: Secondary | ICD-10-CM | POA: Insufficient documentation

## 2016-07-27 DIAGNOSIS — Z7984 Long term (current) use of oral hypoglycemic drugs: Secondary | ICD-10-CM | POA: Diagnosis not present

## 2016-07-27 DIAGNOSIS — Z79899 Other long term (current) drug therapy: Secondary | ICD-10-CM | POA: Diagnosis not present

## 2016-07-27 HISTORY — DX: Old myocardial infarction: I25.2

## 2016-07-27 LAB — URINALYSIS COMPLETE WITH MICROSCOPIC (ARMC ONLY)
BACTERIA UA: NONE SEEN
BILIRUBIN URINE: NEGATIVE
GLUCOSE, UA: 50 mg/dL — AB
Hgb urine dipstick: NEGATIVE
KETONES UR: NEGATIVE mg/dL
LEUKOCYTES UA: NEGATIVE
NITRITE: NEGATIVE
PH: 5 (ref 5.0–8.0)
Protein, ur: 30 mg/dL — AB
Specific Gravity, Urine: 1.021 (ref 1.005–1.030)
WBC UA: NONE SEEN WBC/hpf (ref 0–5)

## 2016-07-27 LAB — CBC WITH DIFFERENTIAL/PLATELET
BASOS PCT: 1 %
Basophils Absolute: 0.1 10*3/uL (ref 0–0.1)
EOS ABS: 0.3 10*3/uL (ref 0–0.7)
Eosinophils Relative: 4 %
HCT: 40.5 % (ref 40.0–52.0)
HEMOGLOBIN: 14.1 g/dL (ref 13.0–18.0)
Lymphocytes Relative: 18 %
Lymphs Abs: 1.3 10*3/uL (ref 1.0–3.6)
MCH: 31.6 pg (ref 26.0–34.0)
MCHC: 34.8 g/dL (ref 32.0–36.0)
MCV: 90.8 fL (ref 80.0–100.0)
MONOS PCT: 7 %
Monocytes Absolute: 0.5 10*3/uL (ref 0.2–1.0)
NEUTROS PCT: 70 %
Neutro Abs: 5.3 10*3/uL (ref 1.4–6.5)
Platelets: 148 10*3/uL — ABNORMAL LOW (ref 150–440)
RBC: 4.46 MIL/uL (ref 4.40–5.90)
RDW: 14.3 % (ref 11.5–14.5)
WBC: 7.6 10*3/uL (ref 3.8–10.6)

## 2016-07-27 LAB — COMPREHENSIVE METABOLIC PANEL
ALT: 96 U/L — AB (ref 17–63)
ANION GAP: 7 (ref 5–15)
AST: 66 U/L — ABNORMAL HIGH (ref 15–41)
Albumin: 3.5 g/dL (ref 3.5–5.0)
Alkaline Phosphatase: 31 U/L — ABNORMAL LOW (ref 38–126)
BUN: 24 mg/dL — ABNORMAL HIGH (ref 6–20)
CHLORIDE: 105 mmol/L (ref 101–111)
CO2: 24 mmol/L (ref 22–32)
Calcium: 8.9 mg/dL (ref 8.9–10.3)
Creatinine, Ser: 1.07 mg/dL (ref 0.61–1.24)
GFR calc non Af Amer: >60 mL/min (ref >60)
Glucose, Bld: 202 mg/dL — ABNORMAL HIGH (ref 65–99)
Potassium: 4 mmol/L (ref 3.5–5.1)
SODIUM: 136 mmol/L (ref 135–145)
Total Bilirubin: 0.6 mg/dL (ref 0.3–1.2)
Total Protein: 5.9 g/dL — ABNORMAL LOW (ref 6.5–8.1)

## 2016-07-27 LAB — LIPASE, BLOOD: LIPASE: 51 U/L (ref 11–51)

## 2016-07-27 MED ORDER — ACETAMINOPHEN 325 MG PO TABS
650.0000 mg | ORAL_TABLET | Freq: Once | ORAL | Status: AC
  Administered 2016-07-27: 650 mg via ORAL
  Filled 2016-07-27: qty 2

## 2016-07-27 MED ORDER — METRONIDAZOLE 500 MG PO TABS
500.0000 mg | ORAL_TABLET | Freq: Once | ORAL | Status: AC
  Administered 2016-07-27: 500 mg via ORAL
  Filled 2016-07-27: qty 1

## 2016-07-27 MED ORDER — OXYCODONE-ACETAMINOPHEN 5-325 MG PO TABS: 1.0000 | ORAL_TABLET | Freq: Four times a day (QID) | ORAL | 0 refills | Status: DC | PRN

## 2016-07-27 MED ORDER — METRONIDAZOLE 500 MG PO TABS: 500.0000 mg | ORAL_TABLET | Freq: Three times a day (TID) | ORAL | 0 refills | Status: AC

## 2016-07-27 MED ORDER — CIPROFLOXACIN HCL 500 MG PO TABS
500.0000 mg | ORAL_TABLET | Freq: Once | ORAL | Status: AC
  Administered 2016-07-27: 500 mg via ORAL
  Filled 2016-07-27: qty 1

## 2016-07-27 MED ORDER — CIPROFLOXACIN HCL 500 MG PO TABS: 500.0000 mg | ORAL_TABLET | Freq: Two times a day (BID) | ORAL | 0 refills | Status: AC

## 2016-07-27 NOTE — ED Triage Notes (Signed)
Regarding heartrate, patient states has been on monitor for slow heart rate.

## 2016-07-27 NOTE — ED Triage Notes (Signed)
Lower abdominal pain x 3 days, history of diverticulitis, feels the same.

## 2016-07-27 NOTE — ED Notes (Signed)
Pt has hx of diverticulitis that he states they usually can just give him antibiotics for. States its been a few years since last diverticulitis. Also has ibs that he takes a pill for but since it didn't help this time believes its his diverticulitis. Pt comfortable at this time.

## 2016-07-27 NOTE — ED Provider Notes (Addendum)
[facility] Emergency Department Provider Note   ____________________________________________   First MD Initiated Contact with Patient 07/27/16 1057     (approximate)  I have reviewed the triage vital signs and the nursing notes.   HISTORY  Chief Complaint Abdominal Pain   HPI Ronald Whitaker is a 55 y.o. male with a history of atrial fibrillation on eliquis as well as diverticulitis and colonic spasm who is presenting emergency department with 3 days of worsening left lower quadrant abdominal pain. He says the pain right now as a 5 out of 10 but is waxing and waning. He says that he has tried dicyclomine for this over the past several days and he says that it reduces the pain a little bit but he is still left with a pressure to his suprapubic region as well as left lower quadrant.He says it is also noticed a small amount of blood on the outside of otherwise brown stool. He also has a history of hemorrhoids. Says this feels similar to previous episodes of diverticulitis. Does not report any nausea or vomiting.   Past Medical History:  Diagnosis Date  . Atrial fibrillation (HCC)   . Diabetes mellitus without complication (HCC)   . Gout   . Hypertension   . MI, old   . Thyroid disease    hypothyroid    There are no active problems to display for this patient.   Past Surgical History:  Procedure Laterality Date  . CORONARY ARTERY BYPASS GRAFT     2014    Prior to Admission medications   Medication Sig Start Date End Date Taking? Authorizing Provider  acetaminophen (TYLENOL) 500 MG tablet Take 1,000 mg by mouth every 6 (six) hours as needed for mild pain or headache.    Historical Provider, MD  allopurinol (ZYLOPRIM) 300 MG tablet Take 300 mg by mouth daily.    Historical Provider, MD  amiodarone (PACERONE) 200 MG tablet Take 200 mg by mouth daily.    Historical Provider, MD  amLODipine (NORVASC) 10 MG tablet Take 10 mg by mouth daily.     Historical Provider, MD  apixaban (ELIQUIS) 5 MG TABS tablet Take 5 mg by mouth 2 (two) times daily.    Historical Provider, MD  aspirin EC 81 MG tablet Take 81 mg by mouth daily.    Historical Provider, MD  atorvastatin (LIPITOR) 40 MG tablet Take 40 mg by mouth at bedtime.    Historical Provider, MD  benazepril (LOTENSIN) 20 MG tablet Take 20 mg by mouth daily.    Historical Provider, MD  dicyclomine (BENTYL) 10 MG capsule Take 10 mg by mouth 4 (four) times daily as needed (for abdominal pain).    Historical Provider, MD  dorzolamide-timolol (COSOPT) 22.3-6.8 MG/ML ophthalmic solution Place 1 drop into both eyes 2 (two) times daily.    Historical Provider, MD  latanoprost (XALATAN) 0.005 % ophthalmic solution Place 1 drop into both eyes at bedtime.    Historical Provider, MD  levothyroxine (SYNTHROID, LEVOTHROID) 175 MCG tablet Take 175 mcg by mouth daily before breakfast.    Historical Provider, MD  LORazepam (ATIVAN) 0.5 MG tablet Take 0.5 mg by mouth daily as needed for anxiety.    Historical Provider, MD  metFORMIN (GLUCOPHAGE) 500 MG tablet Take 1,000 mg by mouth 2 (two) times daily with a meal.    Historical Provider, MD  metoprolol (LOPRESSOR) 50 MG tablet Take 50 mg by mouth 2 (two) times daily.    Historical Provider, MD  Multiple Vitamin (MULTIVITAMIN WITH MINERALS) TABS tablet Take 1 tablet by mouth daily.    Historical Provider, MD  pantoprazole (PROTONIX) 40 MG tablet Take 40 mg by mouth daily.    Historical Provider, MD  traMADol (ULTRAM) 50 MG tablet Take 1 tablet (50 mg total) by mouth every 6 (six) hours as needed. 02/07/16   Jennye MoccasinBrian S Quigley, MD    Allergies Colchicine  No family history on file.  Social History Social History  Substance Use Topics  . Smoking status: Never Smoker  . Smokeless tobacco: Never Used  . Alcohol use No    Review of Systems Constitutional: No fever/chills Eyes: No visual changes. ENT: No sore throat. Cardiovascular: Denies chest  pain. Respiratory: Denies shortness of breath. Gastrointestinal:  No nausea, no vomiting.  No diarrhea.  No constipation. Genitourinary: Negative for dysuria. Musculoskeletal: Negative for back pain. Skin: Negative for rash. Neurological: Negative for headaches, focal weakness or numbness.  10-point ROS otherwise negative.  ____________________________________________   PHYSICAL EXAM:  VITAL SIGNS: ED Triage Vitals  Enc Vitals Group     BP 07/27/16 1043 121/69     Pulse Rate 07/27/16 1043 (!) 41     Resp 07/27/16 1043 20     Temp 07/27/16 1043 98.1 F (36.7 C)     Temp Source 07/27/16 1043 Oral     SpO2 07/27/16 1043 98 %     Weight 07/27/16 1053 210 lb (95.3 kg)     Height 07/27/16 1053 5\' 10"  (1.778 m)     Head Circumference --      Peak Flow --      Pain Score 07/27/16 1054 5     Pain Loc --      Pain Edu? --      Excl. in GC? --     Constitutional: Alert and oriented. Well appearing and in no acute distress. Eyes: Conjunctivae are normal. PERRL. EOMI. Head: Atraumatic. Nose: No congestion/rhinnorhea. Mouth/Throat: Mucous membranes are moist. Neck: No stridor.   Cardiovascular: Normal rate, regular rhythm. Grossly normal heart sounds.   Respiratory: Normal respiratory effort.  No retractions. Lungs CTAB. Gastrointestinal: Soft With mild suprapubic as well as left lower quadrant tenderness. No rebound or guarding. No distention.No CVA tenderness. Musculoskeletal: No lower extremity tenderness nor edema.  No joint effusions. Neurologic:  Normal speech and language. No gross focal neurologic deficits are appreciated. Skin:  Skin is warm, dry and intact. No rash noted. Psychiatric: Mood and affect are normal. Speech and behavior are normal.  ____________________________________________   LABS (all labs ordered are listed, but only abnormal results are displayed)  Labs Reviewed  CBC WITH DIFFERENTIAL/PLATELET - Abnormal; Notable for the following:       Result  Value   Platelets 148 (*)    All other components within normal limits  COMPREHENSIVE METABOLIC PANEL - Abnormal; Notable for the following:    Glucose, Bld 202 (*)    BUN 24 (*)    Total Protein 5.9 (*)    AST 66 (*)    ALT 96 (*)    Alkaline Phosphatase 31 (*)    All other components within normal limits  URINALYSIS COMPLETEWITH MICROSCOPIC ([facility] ONLY) - Abnormal; Notable for the following:    Color, Urine YELLOW (*)    APPearance CLEAR (*)    Glucose, UA 50 (*)    Protein, ur 30 (*)    Squamous Epithelial / LPF 0-5 (*)    All other components within normal limits  LIPASE, BLOOD  ____________________________________________  EKG   ____________________________________________  RADIOLOGY   ____________________________________________   PROCEDURES  Procedure(s) performed:   Procedures  Critical Care performed:   ____________________________________________   INITIAL IMPRESSION / ASSESSMENT AND PLAN / ED COURSE  Pertinent labs & imaging results that were available during my care of the patient were reviewed by me and considered in my medical decision making (see chart for details).  ----------------------------------------- 1:14 PM on 07/27/2016 -----------------------------------------  Very reassuring workup with a negative white count. Patient remains comfortable in the room. The pain was improved with Tylenol. However, he is requesting several Percocets for breakthrough pain. I did review his controlled substances reporting system before and he has not had any pain medications prescribed since 02/07/2016 when he was prescribed 20 pills of tramadol.  Will discharge with Cipro and Flagyl for presumed mild diverticulitis. I also discussed with the patient and he must return immediately for any worsening or concerning symptoms especially fever or worsening abdominal pain. He is understanding of this plan and willing to comply. I will not repeat CAT scan today.  The patient does have a CAT scan in March of this year, and feel that his exam does not merit advanced imaging at this time.    Clinical Course     ____________________________________________   FINAL CLINICAL IMPRESSION(S) / ED DIAGNOSES  Diverticulitis    NEW MEDICATIONS STARTED DURING THIS VISIT:  New Prescriptions   No medications on file     Note:  This document was prepared using Dragon voice recognition software and may include unintentional dictation errors.    Myrna Blazer, MD 07/27/16 1316  Furthermore, the patient has been in the 40s in the 60s on the monitor but he has known atrial fibrillation. He has been closely followed as an outpatient for possible medication adjustment for his periods of slower heart rate. He has a symptomatic without any dizziness or lightheadedness or feelings that he will pass out.   Myrna Blazer, MD 07/27/16 [phone]

## 2016-09-03 IMAGING — CT CT ANGIO CHEST
1 of 2 series · 19 of 32 positions shown · IV contrast (APPLIED)
Comparison: 02/26/2015

CLINICAL DATA: Chest pain

EXAM:
CT ANGIOGRAPHY CHEST WITH CONTRAST
TECHNIQUE: Multidetector CT imaging of the chest was performed using the
standard protocol during bolus administration of intravenous
contrast. Multiplanar CT image reconstructions and MIPs were
obtained to evaluate the vascular anatomy.
CONTRAST:  100mL OMNIPAQUE IOHEXOL 350 MG/ML SOLN

[Series 6: cta chest · axial · 0.84mm/px · z∈[-963,-679]mm · 19 of 158 slices shown]
[im 8/158  lung]
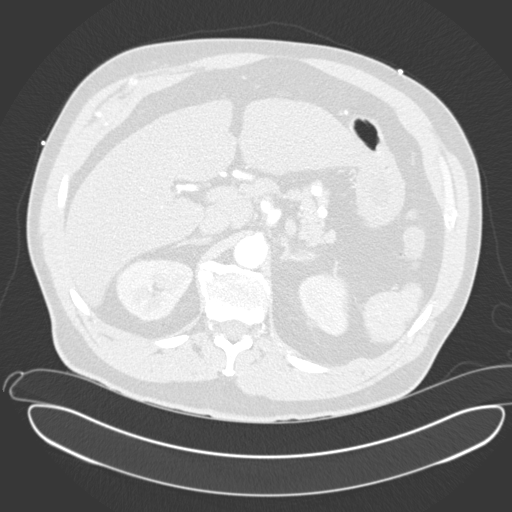
[im 15/158  soft-tissue]
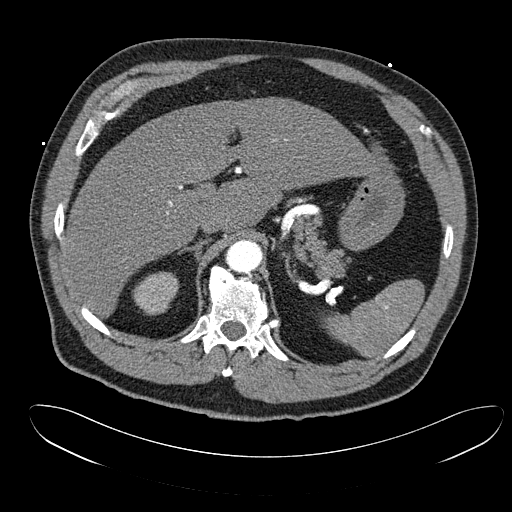
[im 22/158  lung]
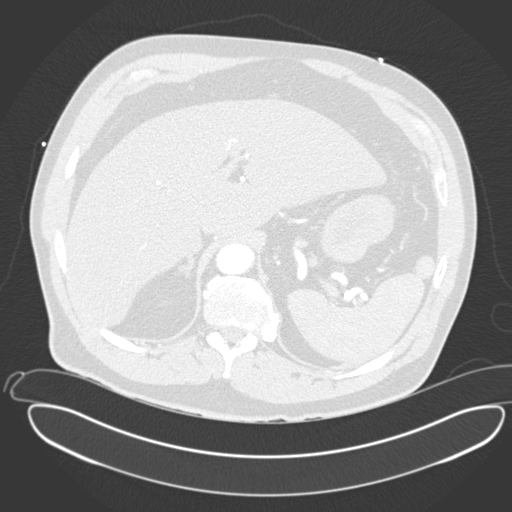
[im 29/158  soft-tissue]
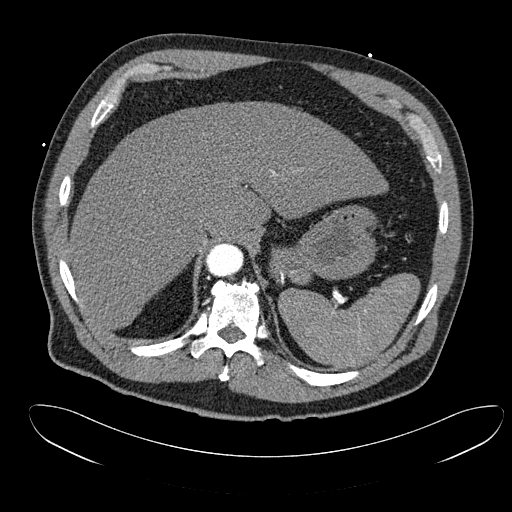
[im 36/158  lung]
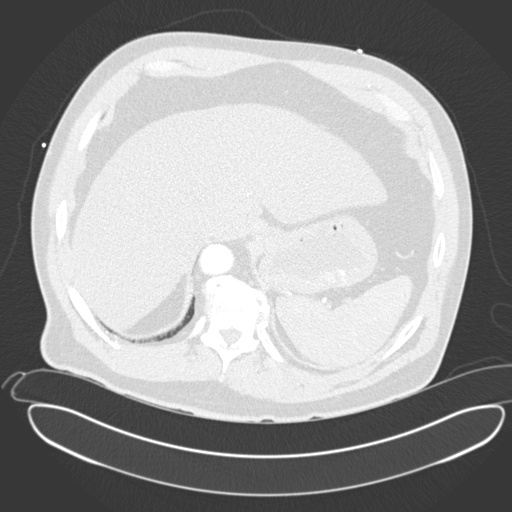
[im 50/158  soft-tissue]
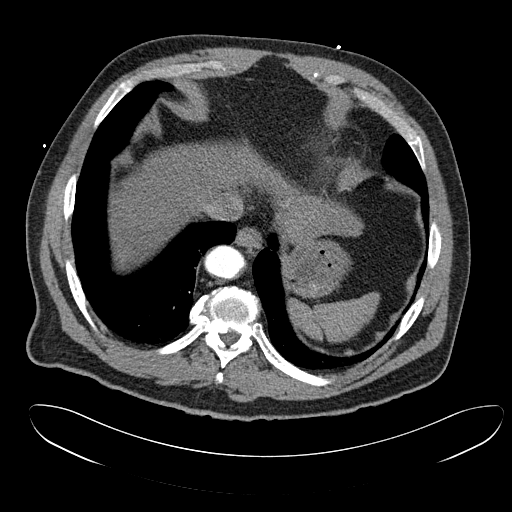
[im 58/158  lung]
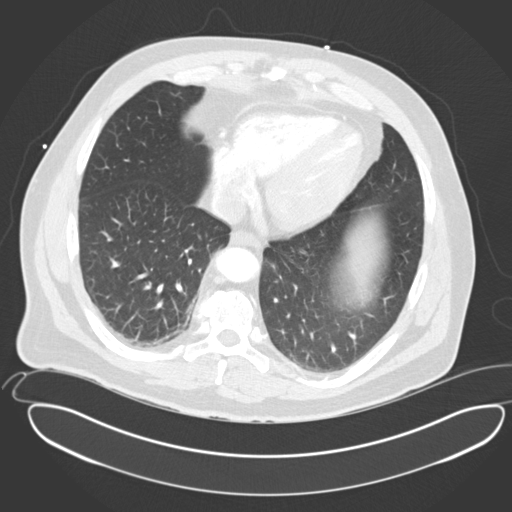
[im 65/158  soft-tissue]
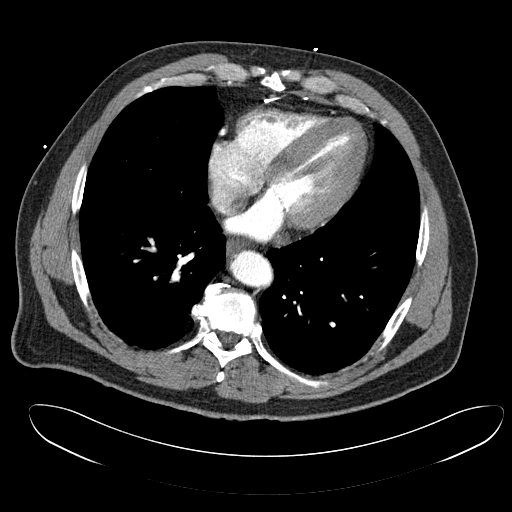
[im 72/158  lung]
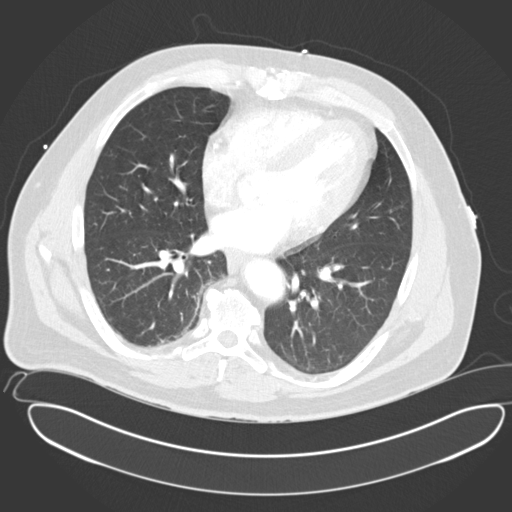
[im 79/158  soft-tissue]
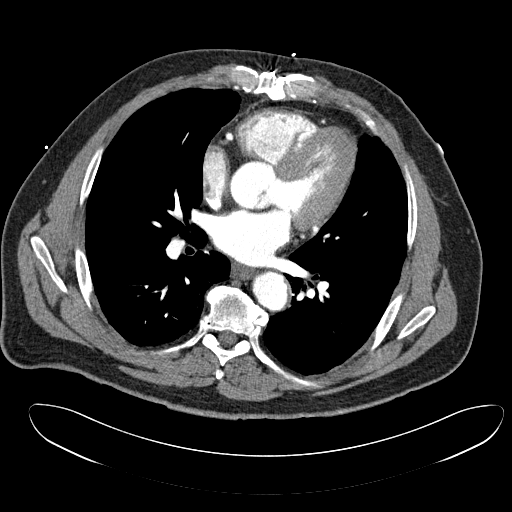
[im 86/158  lung]
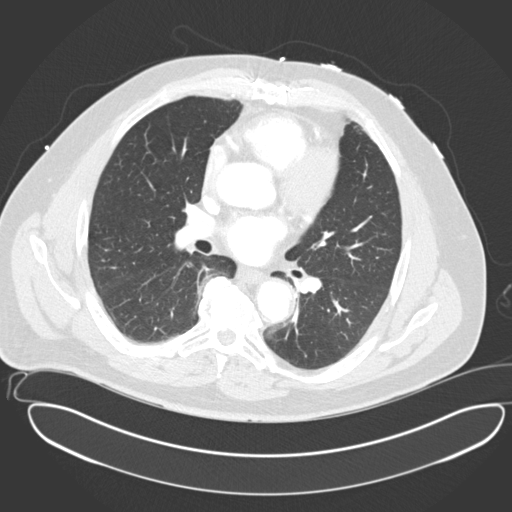
[im 93/158  soft-tissue]
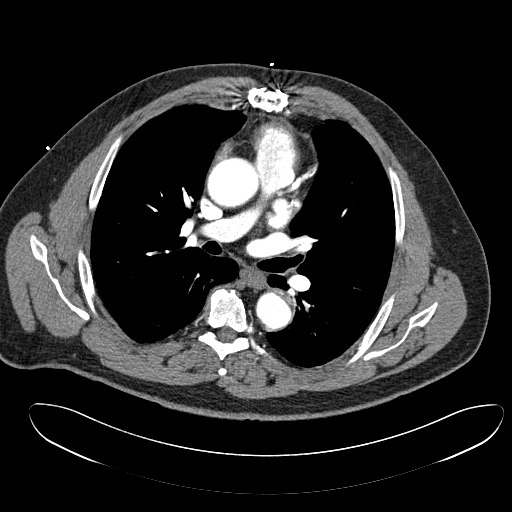
[im 100/158  lung]
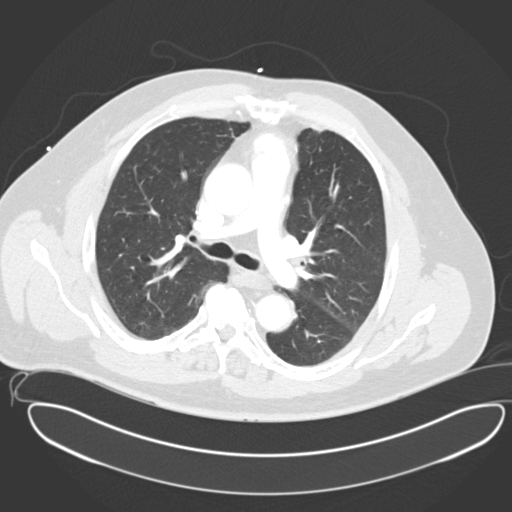
[im 108/158  soft-tissue]
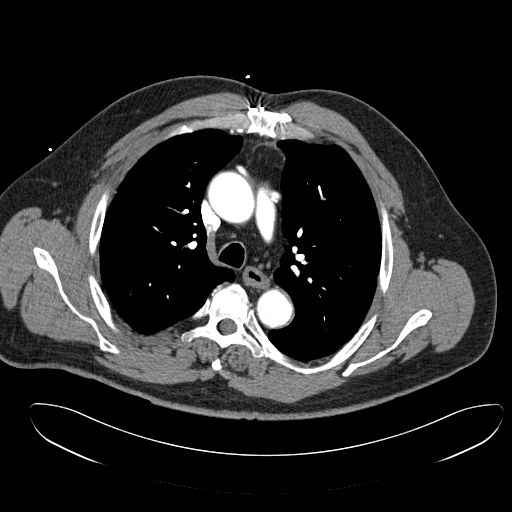
[im 122/158  lung]
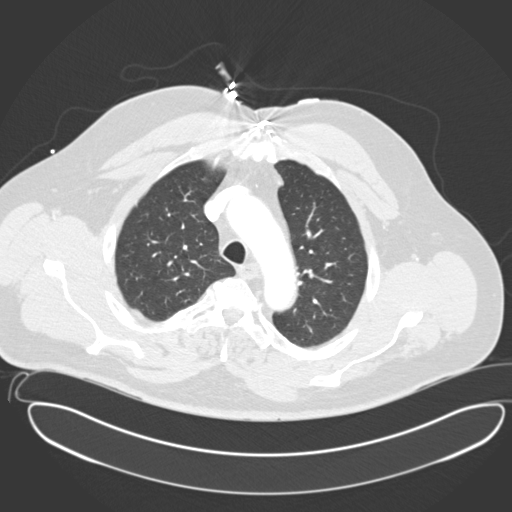
[im 129/158  soft-tissue]
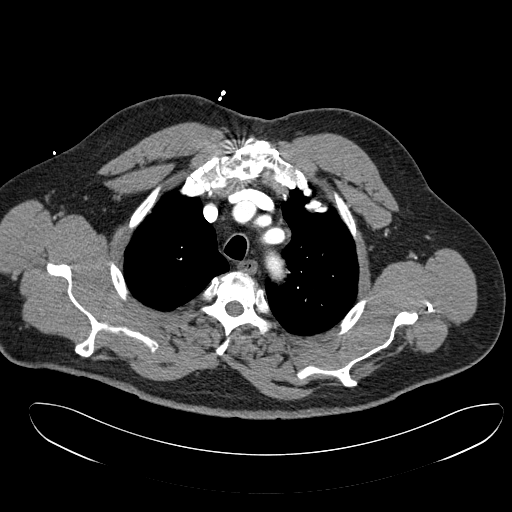
[im 136/158  lung]
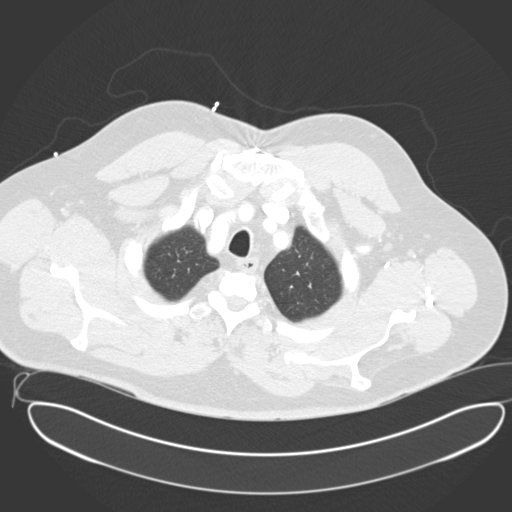
[im 143/158  soft-tissue]
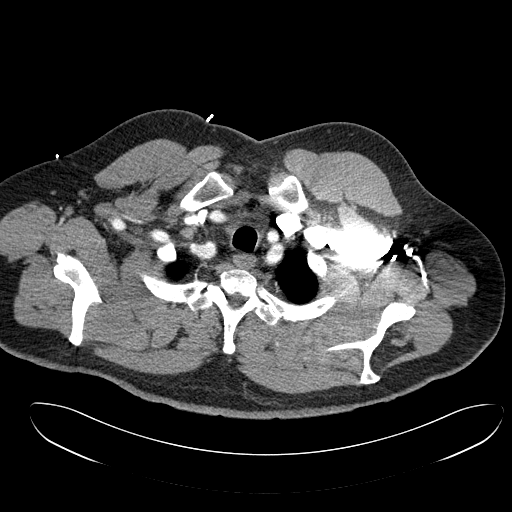
[im 150/158  lung]
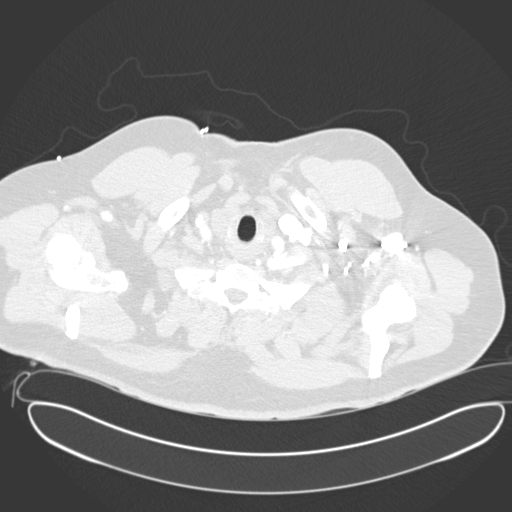

[19 of 32 positions shown; findings below may reference images not displayed]

FINDINGS: The lungs are well aerated bilaterally. No focal infiltrate or
sizable effusion is seen.

The thoracic inlet is within normal limits. The thoracic aorta
demonstrates some mild calcific changes consistent with
atherosclerotic disease. Patent coronary bypass grafts are seen. The
pulmonary artery demonstrates a normal branching pattern. No
findings to suggest pulmonary embolism are seen. No hilar or
mediastinal adenopathy is noted.

The visualized upper abdomen reveals no acute abnormality.
Degenerative changes of the thoracic spine are noted.

Review of the MIP images confirms the above findings.
IMPRESSION: No evidence pulmonary emboli.

Status post coronary bypass grafting.

No other focal abnormality is seen.

## 2020-02-11 ENCOUNTER — Other Ambulatory Visit: Payer: Self-pay

## 2020-02-11 ENCOUNTER — Ambulatory Visit: Payer: BLUE CROSS/BLUE SHIELD | Attending: Internal Medicine

## 2020-02-11 DIAGNOSIS — Z23 Encounter for immunization: Secondary | ICD-10-CM

## 2020-02-11 NOTE — Progress Notes (Signed)
   Covid-19 Vaccination Clinic  Name:  Ronald Whitaker    MRN: [id number] DOB: 03/25/1961  02/11/2020  [name] was observed post Covid-19 immunization for 15 minutes without incident. He was provided with Vaccine Information Sheet and instruction to access the V-Safe system.   [name] was instructed to call 911 with any severe reactions post vaccine: Marland Kitchen Difficulty breathing  . Swelling of face and throat  . A fast heartbeat  . A bad rash all over body  . Dizziness and weakness   Immunizations Administered    Name Date Dose VIS Date Route   Pfizer COVID-19 Vaccine 02/11/2020  3:35 PM 0.3 mL 11/05/2019 Intramuscular   Manufacturer: [employer], [employer]   Lot: [id number]   NDC: [address]-2

## 2020-03-03 ENCOUNTER — Ambulatory Visit: Payer: BLUE CROSS/BLUE SHIELD | Attending: Internal Medicine

## 2020-03-03 DIAGNOSIS — Z23 Encounter for immunization: Secondary | ICD-10-CM

## 2020-03-03 NOTE — Progress Notes (Signed)
   Covid-19 Vaccination Clinic  Name:  Ronald Whitaker    MRN: [id number] DOB: November 20, 1961  03/03/2020  Mr. Ronald Whitaker was observed post Covid-19 immunization for 15 minutes without incident. He was provided with Vaccine Information Sheet and instruction to access the V-Safe system.   Mr. Ronald Whitaker was instructed to call 911 with any severe reactions post vaccine: Marland Kitchen Difficulty breathing  . Swelling of face and throat  . A fast heartbeat  . A bad rash all over body  . Dizziness and weakness   Immunizations Administered    Name Date Dose VIS Date Route   Pfizer COVID-19 Vaccine 03/03/2020  3:14 PM 0.3 mL 11/05/2019 Intramuscular   Manufacturer: [employer], [employer]   Lot: [id number]   NDC: [address]-9

## 2023-04-07 NOTE — Patient Instructions (Addendum)
SURGICAL WAITING ROOM VISITATION Patients having surgery or a procedure may have no more than 2 support people in the waiting area - these visitors may rotate.    Children under the age of 79 must have an adult with them who is not the patient.  If the patient needs to stay at the hospital during part of their recovery, the visitor guidelines for inpatient rooms apply. Pre-op nurse will coordinate an appropriate time for 1 support person to accompany patient in pre-op.  This support person may not rotate.    Please refer to the [facility] website for the visitor guidelines for Inpatients (after your surgery is over and you are in a regular room).       Your procedure is scheduled on: 04-22-23   Report to [facility] Main Entrance    Report to admitting at 5:15 AM   Call this number if you have problems the morning of surgery [phone]   Do not eat food :After Midnight.   After Midnight you may have the following liquids until 4:15 AM DAY OF SURGERY  Water Non-Citrus Juices (without pulp, NO RED-Apple, White grape, White cranberry) Black Coffee (NO MILK/CREAM OR CREAMERS, sugar ok)  Clear Tea (NO MILK/CREAM OR CREAMERS, sugar ok) regular and decaf                             Plain Jell-O (NO RED)                                           Fruit ices (not with fruit pulp, NO RED)                                     Popsicles (NO RED)                                                               Sports drinks like Gatorade (NO RED)                   The day of surgery:  Drink ONE (1) Pre-Surgery G2 at 4:15 AM the morning of surgery. Drink in one sitting. Do not sip.  This drink was given to you during your hospital  pre-op appointment visit. Nothing else to drink after completing the Pre-SurgeryG2.          If you have questions, please contact your surgeon's office.   FOLLOW  ANY ADDITIONAL PRE OP INSTRUCTIONS YOU RECEIVED FROM YOUR SURGEON'S OFFICE!!!     Oral  Hygiene is also important to reduce your risk of infection.                                    Remember - BRUSH YOUR TEETH THE MORNING OF SURGERY WITH YOUR REGULAR TOOTHPASTE   Do NOT smoke after Midnight   Take these medicines the morning of surgery with A SIP OF WATER:   Allopurinol  Dofetilide  Levothyroxine  Metoprolol  Pantoprazole  Valacyclovir  Tylenol if needed  Okay to use eyedrops  How to Manage Your Diabetes Before and After Surgery  Why is it important to control my blood sugar before and after surgery? Improving blood sugar levels before and after surgery helps healing and can limit problems. A way of improving blood sugar control is eating a healthy diet by:  Eating less sugar and carbohydrates  Increasing activity/exercise  Talking with your doctor about reaching your blood sugar goals High blood sugars (greater than 180 mg/dL) can raise your risk of infections and slow your recovery, so you will need to focus on controlling your diabetes during the weeks before surgery. Make sure that the doctor who takes care of your diabetes knows about your planned surgery including the date and location.  How do I manage my blood sugar before surgery? Check your blood sugar at least 4 times a day, starting 2 days before surgery, to make sure that the level is not too high or low. Check your blood sugar the morning of your surgery when you wake up and every 2 hours until you get to the Short Stay unit. If your blood sugar is less than 70 mg/dL, you will need to treat for low blood sugar: Do not take insulin. Treat a low blood sugar (less than 70 mg/dL) with  cup of clear juice (cranberry or apple), 4 glucose tablets, OR glucose gel. Recheck blood sugar in 15 minutes after treatment (to make sure it is greater than 70 mg/dL). If your blood sugar is not greater than 70 mg/dL on recheck, call [phone] for further instructions. Report your blood sugar to the short stay nurse when  you get to Short Stay.  If you are admitted to the hospital after surgery: Your blood sugar will be checked by the staff and you will probably be given insulin after surgery (instead of oral diabetes medicines) to make sure you have good blood sugar levels. The goal for blood sugar control after surgery is 80-180 mg/dL.   WHAT DO I DO ABOUT MY DIABETES MEDICATION?  Do not take oral diabetes medicines (pills) the morning of surgery.  THE NIGHT BEFORE SURGERY, take 16 units of Tresiba      I  DO NOT TAKE THE FOLLOWING 7 DAYS PRIOR TO SURGERY: Ozempic, Wegovy, Rybelsus (Semaglutide), Byetta (exenatide), Bydureon (exenatide ER), Victoza, Saxenda (liraglutide), or Trulicity (dulaglutide) Mounjaro (Tirzepatide) Adlyxin (Lixisenatide), Polyethylene Glycol Loxenatide.   Reviewed and Endorsed by [facility] Patient Education Committee, August 2015                              You may not have any metal on your body including jewelry, and body piercing             Do not wear  lotions, powders, cologne, or deodorant              Men may shave face and neck.   Do not bring valuables to the hospital. [facility] IS NOT RESPONSIBLE   FOR VALUABLES.   Contacts, dentures or bridgework may not be worn into surgery.   Bring small overnight bag day of surgery.   DO NOT BRING YOUR HOME MEDICATIONS TO THE HOSPITAL. PHARMACY WILL DISPENSE MEDICATIONS LISTED ON YOUR MEDICATION LIST TO YOU DURING YOUR ADMISSION IN THE HOSPITAL!                 Please read over  the following fact sheets you were given: IF YOU HAVE QUESTIONS ABOUT YOUR PRE-OP INSTRUCTIONS PLEASE CALL [phone] Gwen  If you received a COVID test during your pre-op visit  it is requested that you wear a mask when out in public, stay away from anyone that may not be feeling well and notify your surgeon if you develop symptoms. If you test positive for Covid or have been in contact with anyone that has tested positive in the last 10 days  please notify you surgeon.    Pre-operative 5 CHG Bath Instructions   You can play a key role in reducing the risk of infection after surgery. Your skin needs to be as free of germs as possible. You can reduce the number of germs on your skin by washing with CHG (chlorhexidine gluconate) soap before surgery. CHG is an antiseptic soap that kills germs and continues to kill germs even after washing.   DO NOT use if you have an allergy to chlorhexidine/CHG or antibacterial soaps. If your skin becomes reddened or irritated, stop using the CHG and notify one of our RNs at  [phone] .   Please shower with the CHG soap starting 4 days before surgery using the following schedule:     Please keep in mind the following:  DO NOT shave, including legs and underarms, starting the day of your first shower.   You may shave your face at any point before/day of surgery.  Place clean sheets on your bed the day you start using CHG soap. Use a clean washcloth (not used since being washed) for each shower. DO NOT sleep with pets once you start using the CHG.   CHG Shower Instructions:  If you choose to wash your hair and private area, wash first with your normal shampoo/soap.  After you use shampoo/soap, rinse your hair and body thoroughly to remove shampoo/soap residue.  Turn the water OFF and apply about 3 tablespoons (45 ml) of CHG soap to a CLEAN washcloth.  Apply CHG soap ONLY FROM YOUR NECK DOWN TO YOUR TOES (washing for 3-5 minutes)  DO NOT use CHG soap on face, private areas, open wounds, or sores.  Pay special attention to the area where your surgery is being performed.  If you are having back surgery, having someone wash your back for you may be helpful. Wait 2 minutes after CHG soap is applied, then you may rinse off the CHG soap.  Pat dry with a clean towel  Put on clean clothes/pajamas   If you choose to wear lotion, please use ONLY the CHG-compatible lotions on the back of this paper.      Additional instructions for the day of surgery: DO NOT APPLY any lotions, deodorants, cologne, or perfumes.   Put on clean/comfortable clothes.  Brush your teeth.  Ask your nurse before applying any prescription medications to the skin.      CHG Compatible Lotions   Aveeno Moisturizing lotion  Cetaphil Moisturizing Cream  Cetaphil Moisturizing Lotion  Clairol Herbal Essence Moisturizing Lotion, Dry Skin  Clairol Herbal Essence Moisturizing Lotion, Extra Dry Skin  Clairol Herbal Essence Moisturizing Lotion, Normal Skin  Curel Age Defying Therapeutic Moisturizing Lotion with Alpha Hydroxy  Curel Extreme Care Body Lotion  Curel Soothing Hands Moisturizing Hand Lotion  Curel Therapeutic Moisturizing Cream, Fragrance-Free  Curel Therapeutic Moisturizing Lotion, Fragrance-Free  Curel Therapeutic Moisturizing Lotion, Original Formula  Eucerin Daily Replenishing Lotion  Eucerin Dry Skin Therapy Plus Alpha Hydroxy Crme  Eucerin Dry Skin Therapy  Plus Alpha Hydroxy Lotion  Eucerin Original Crme  Eucerin Original Lotion  Eucerin Plus Crme Eucerin Plus Lotion  Eucerin TriLipid Replenishing Lotion  Keri Anti-Bacterial Hand Lotion  Keri Deep Conditioning Original Lotion Dry Skin Formula Softly Scented  Keri Deep Conditioning Original Lotion, Fragrance Free Sensitive Skin Formula  Keri Lotion Fast Absorbing Fragrance Free Sensitive Skin Formula  Keri Lotion Fast Absorbing Softly Scented Dry Skin Formula  Keri Original Lotion  Keri Skin Renewal Lotion Keri Silky Smooth Lotion  Keri Silky Smooth Sensitive Skin Lotion  Nivea Body Creamy Conditioning Oil  Nivea Body Extra Enriched Lotion  Nivea Body Original Lotion  Nivea Body Sheer Moisturizing Lotion Nivea Crme  Nivea Skin Firming Lotion  NutraDerm 30 Skin Lotion  NutraDerm Skin Lotion  NutraDerm Therapeutic Skin Cream  NutraDerm Therapeutic Skin Lotion  ProShield Protective Hand Cream  Provon moisturizing lotion   PATIENT  SIGNATURE_________________________________  NURSE SIGNATURE__________________________________  ________________________________________________________________________    Rogelia Mire  An incentive spirometer is a tool that can help keep your lungs clear and active. This tool measures how well you are filling your lungs with each breath. Taking long deep breaths may help reverse or decrease the chance of developing breathing (pulmonary) problems (especially infection) following: A long period of time when you are unable to move or be active. BEFORE THE PROCEDURE  If the spirometer includes an indicator to show your best effort, your nurse or respiratory therapist will set it to a desired goal. If possible, sit up straight or lean slightly forward. Try not to slouch. Hold the incentive spirometer in an upright position. INSTRUCTIONS FOR USE  Sit on the edge of your bed if possible, or sit up as far as you can in bed or on a chair. Hold the incentive spirometer in an upright position. Breathe out normally. Place the mouthpiece in your mouth and seal your lips tightly around it. Breathe in slowly and as deeply as possible, raising the piston or the ball toward the top of the column. Hold your breath for 3-5 seconds or for as long as possible. Allow the piston or ball to fall to the bottom of the column. Remove the mouthpiece from your mouth and breathe out normally. Rest for a few seconds and repeat Steps 1 through 7 at least 10 times every 1-2 hours when you are awake. Take your time and take a few normal breaths between deep breaths. The spirometer may include an indicator to show your best effort. Use the indicator as a goal to work toward during each repetition. After each set of 10 deep breaths, practice coughing to be sure your lungs are clear. If you have an incision (the cut made at the time of surgery), support your incision when coughing by placing a pillow or rolled up towels  firmly against it. Once you are able to get out of bed, walk around indoors and cough well. You may stop using the incentive spirometer when instructed by your caregiver.  RISKS AND COMPLICATIONS Take your time so you do not get dizzy or light-headed. If you are in pain, you may need to take or ask for pain medication before doing incentive spirometry. It is harder to take a deep breath if you are having pain. AFTER USE Rest and breathe slowly and easily. It can be helpful to keep track of a log of your progress. Your caregiver can provide you with a simple table to help with this. If you are using the spirometer at home, follow  these instructions: SEEK MEDICAL CARE IF:  You are having difficultly using the spirometer. You have trouble using the spirometer as often as instructed. Your pain medication is not giving enough relief while using the spirometer. You develop fever of 100.5 F (38.1 C) or higher. SEEK IMMEDIATE MEDICAL CARE IF:  You cough up bloody sputum that had not been present before. You develop fever of 102 F (38.9 C) or greater. You develop worsening pain at or near the incision site. MAKE SURE YOU:  Understand these instructions. Will watch your condition. Will get help right away if you are not doing well or get worse. Document Released: 03/24/2007 Document Revised: 02/03/2012 Document Reviewed: 05/25/2007 [facility] Patient Information 2014 [place], [place].   ________________________________________________________________________

## 2023-04-07 NOTE — Progress Notes (Addendum)
COVID Vaccine Completed:  Yes  Date of COVID positive in last 90 days:  No  PCP - Viviano Simas, NP Cardiologist - Harvel Ricks, ANP Pulmonologist - Patrcia Dolly, PA  Chest x-ray - N/A EKG - 01-19-23 [facility] Stress Test - 02-25-20 [facility] ECHO - 02-18-23 [facility] Cardiac Cath - 2014 Pacemaker/ICD device last checked: Spinal Cord Stimulator:N/A  Bowel Prep - N/A  Sleep Study - Yes, +sleep apnea CPAP - No  Fasting Blood Sugar -  Checks Blood Sugar - checks occasionally   Last dose of GLP1 agonist-  N/A GLP1 instructions:  N/A   Last dose of SGLT-2 inhibitors-  N/A SGLT-2 instructions: N/A  Blood Thinner Instructions:  Eliquis.  Per patient to stop 3 days .  Takes at 8 AM and 8 PM Aspirin Instructions:   Last Dose:  Activity level:  Can go up a flight of stairs and perform activities of daily living without stopping and without symptoms of chest pain or shortness of breath.  Able to exercise without symptoms  Anesthesia review:  Afib, hx of MI, Hx of CABG, HTN, DM  Patient denies shortness of breath, fever, cough and chest pain at PAT appointment  Patient verbalized understanding of instructions that were given to them at the PAT appointment. Patient was also instructed that they will need to review over the PAT instructions again at home before surgery.

## 2023-04-11 ENCOUNTER — Other Ambulatory Visit: Payer: Self-pay

## 2023-04-11 ENCOUNTER — Encounter (HOSPITAL_COMMUNITY)
Admission: RE | Admit: 2023-04-11 | Discharge: 2023-04-11 | Disposition: A | Discharge status: Home / Self Care | Payer: Medicare HMO | Source: Ambulatory Visit | Attending: Orthopedic Surgery | Admitting: Orthopedic Surgery

## 2023-04-11 ENCOUNTER — Encounter (HOSPITAL_COMMUNITY): Payer: Self-pay

## 2023-04-11 VITALS — BP 129/88 | HR 53 | Temp 97.9°F | Resp 16 | Ht 69.0 in | Wt 216.2 lb

## 2023-04-11 DIAGNOSIS — I10 Essential (primary) hypertension: Secondary | ICD-10-CM | POA: Diagnosis not present

## 2023-04-11 DIAGNOSIS — E119 Type 2 diabetes mellitus without complications: Secondary | ICD-10-CM

## 2023-04-11 DIAGNOSIS — Z01812 Encounter for preprocedural laboratory examination: Secondary | ICD-10-CM | POA: Insufficient documentation

## 2023-04-11 DIAGNOSIS — Z01818 Encounter for other preprocedural examination: Secondary | ICD-10-CM

## 2023-04-11 HISTORY — DX: Hypothyroidism, unspecified: E03.9

## 2023-04-11 HISTORY — DX: Unspecified osteoarthritis, unspecified site: M19.90

## 2023-04-11 LAB — BASIC METABOLIC PANEL
Anion gap: 7 (ref 5–15)
BUN: 23 mg/dL (ref 8–23)
CO2: 26 mmol/L (ref 22–32)
Calcium: 9.1 mg/dL (ref 8.9–10.3)
Chloride: 104 mmol/L (ref 98–111)
Creatinine, Ser: 1.36 mg/dL — ABNORMAL HIGH (ref 0.61–1.24)
GFR, Estimated: 59 mL/min — ABNORMAL LOW (ref >60)
Glucose, Bld: 145 mg/dL — ABNORMAL HIGH (ref 70–99)
Potassium: 4.3 mmol/L (ref 3.5–5.1)
Sodium: 137 mmol/L (ref 135–145)

## 2023-04-11 LAB — HEMOGLOBIN A1C
Hgb A1c MFr Bld: 7.1 % — ABNORMAL HIGH (ref 4.8–5.6)
Mean Plasma Glucose: 157.07 mg/dL

## 2023-04-11 LAB — CBC
HCT: 38.4 % — ABNORMAL LOW (ref 39.0–52.0)
Hemoglobin: 12.4 g/dL — ABNORMAL LOW (ref 13.0–17.0)
MCH: 27.9 pg (ref 26.0–34.0)
MCHC: 32.3 g/dL (ref 30.0–36.0)
MCV: 86.3 fL (ref 80.0–100.0)
Platelets: 186 10*3/uL (ref 150–400)
RBC: 4.45 MIL/uL (ref 4.22–5.81)
RDW: 13.4 % (ref 11.5–15.5)
WBC: 5.8 10*3/uL (ref 4.0–10.5)
nRBC: 0 % (ref 0.0–0.2)

## 2023-04-11 LAB — SURGICAL PCR SCREEN
MRSA, PCR: NEGATIVE
Staphylococcus aureus: NEGATIVE

## 2023-04-11 LAB — GLUCOSE, CAPILLARY: Glucose-Capillary: 131 mg/dL — ABNORMAL HIGH (ref 70–99)

## 2023-04-16 NOTE — Anesthesia Preprocedure Evaluation (Addendum)
Anesthesia Evaluation  Patient identified by MRN, date of birth, ID band Patient awake    Reviewed: Allergy & Precautions, H&P , NPO status , Patient's Chart, lab work & pertinent test results  Airway Mallampati: II  TM Distance: >3 FB Neck ROM: Full    Dental no notable dental hx.    Pulmonary neg pulmonary ROS   Pulmonary exam normal breath sounds clear to auscultation       Cardiovascular hypertension, Pt. on medications + CAD  Normal cardiovascular exam Rhythm:Regular Rate:Normal     Neuro/Psych negative neurological ROS  negative psych ROS   GI/Hepatic negative GI ROS, Neg liver ROS,,,  Endo/Other  diabetes, Type 2Hypothyroidism    Renal/GU negative Renal ROS  negative genitourinary   Musculoskeletal  (+) Arthritis , Osteoarthritis,    Abdominal   Peds negative pediatric ROS (+)  Hematology negative hematology ROS (+)   Anesthesia Other Findings   Reproductive/Obstetrics negative OB ROS                             Anesthesia Physical Anesthesia Plan  ASA: 3  Anesthesia Plan: MAC and Spinal   Post-op Pain Management: Regional block*   Induction: Intravenous  PONV Risk Score and Plan: 1 and Ondansetron and Treatment may vary due to age or medical condition  Airway Management Planned: Simple Face Mask  Additional Equipment:   Intra-op Plan:   Post-operative Plan:   Informed Consent: I have reviewed the patients History and Physical, chart, labs and discussed the procedure including the risks, benefits and alternatives for the proposed anesthesia with the patient or authorized representative who has indicated his/her understanding and acceptance.     Dental advisory given  Plan Discussed with: CRNA  Anesthesia Plan Comments: (See PAT note 04/11/2023)       Anesthesia Quick Evaluation

## 2023-04-16 NOTE — Progress Notes (Addendum)
Anesthesia Chart Review   Case: [id number] Date/Time: 04/22/23 0700   Procedure: TOTAL KNEE ARTHROPLASTY (Left: Knee)   Anesthesia type: Spinal   Pre-op diagnosis: Left knee osteoarthritis   Location: [facility] ROOM 09 / [facility]   Surgeons: Durene Romans, MD       DISCUSSION:62 y.o. never smoker with h/o HTN, DM II, atrial fibrillation, CAD (CABG 2014), left knee OA scheduled for above procedure 04/22/2023 with Dr. Durene Romans.   Patient last seen by cardiology 02/10/2023.  Clearance received from cardiology 03/28/2023 which states patient is low to moderate risk.  History of stable CAD and AF.  No further testing needed.  Recent echo stable.  Patient reports he was told to hold his Eliquis 3 days prior to procedure.  Pt reports last dose of Eliquis 04/18/2023 PM dose.   Anticipate pt can proceed with planned procedure barring acute status change.   VS: BP 129/88   Pulse (!) 53   Temp 36.6 C (Oral)   Resp 16   Ht 5\' 9"  (1.753 m)   Wt 98.1 kg   SpO2 98%   BMI 31.93 kg/m   PROVIDERS: Furin, Caryl Asp, FNP is PCP   [facility] [place] [facility] is Cardiologist  LABS: Labs reviewed: Acceptable for surgery. (all labs ordered are listed, but only abnormal results are displayed)  Labs Reviewed  HEMOGLOBIN A1C - Abnormal; Notable for the following components:      Result Value   Hgb A1c MFr Bld 7.1 (*)    All other components within normal limits  BASIC METABOLIC PANEL - Abnormal; Notable for the following components:   Glucose, Bld 145 (*)    Creatinine, Ser 1.36 (*)    GFR, Estimated 59 (*)    All other components within normal limits  CBC - Abnormal; Notable for the following components:   Hemoglobin 12.4 (*)    HCT 38.4 (*)    All other components within normal limits  GLUCOSE, CAPILLARY - Abnormal; Notable for the following components:   Glucose-Capillary 131 (*)    All other components within normal limits  SURGICAL PCR SCREEN     IMAGES:   EKG:   CV: Echo  02/18/2023 Summary   1. The left ventricle is normal in size with mildly increased wall  thickness.   2. The left ventricular systolic function is normal, LVEF is visually  estimated at 55-60%.    3. The mitral valve leaflets are mildly thickened with normal leaflet  mobility.   4. The aortic valve is trileaflet with mildly thickened leaflets with normal  excursion.   5. The left atrium is mildly dilated in size.    6. The right ventricle is normal in size, with normal systolic function.    7. The aorta at the sinuses of Valsalva is mildly dilated.   Past Medical History:  Diagnosis Date   Arthritis    Atrial fibrillation (HCC)    Diabetes mellitus without complication (HCC)    Gout    Hypertension    Hypothyroidism    MI, old    Thyroid disease    hypothyroid    Past Surgical History:  Procedure Laterality Date   BACK SURGERY     CARDIAC CATHETERIZATION     CATARACT EXTRACTION W/ INTRAOCULAR LENS IMPLANT Bilateral    CORONARY ARTERY BYPASS GRAFT     2014    MEDICATIONS:  acetaminophen (TYLENOL) 500 MG tablet   allopurinol (ZYLOPRIM) 100 MG tablet   amLODipine (NORVASC) 10 MG  tablet   apixaban (ELIQUIS) 5 MG TABS tablet   atorvastatin (LIPITOR) 40 MG tablet   benazepril (LOTENSIN) 20 MG tablet   dicyclomine (BENTYL) 10 MG capsule   dofetilide (TIKOSYN) 500 MCG capsule   dorzolamide-timolol (COSOPT) 22.3-6.8 MG/ML ophthalmic solution   latanoprost (XALATAN) 0.005 % ophthalmic solution   levothyroxine (SYNTHROID) 137 MCG tablet   metFORMIN (GLUCOPHAGE) 500 MG tablet   metoprolol tartrate (LOPRESSOR) 25 MG tablet   Multiple Vitamin (MULTIVITAMIN WITH MINERALS) TABS tablet   pantoprazole (PROTONIX) 40 MG tablet   TRESIBA FLEXTOUCH 100 UNIT/ML FlexTouch Pen   valACYclovir (VALTREX) 500 MG tablet   No current facility-administered medications for this encounter.   Jodell Cipro Ward, PA-C [facility] Pre-Surgical Testing [phone]

## 2023-04-19 NOTE — H&P (Signed)
TOTAL KNEE ADMISSION H&P  Patient is being admitted for left total knee arthroplasty.  Subjective:  Chief Complaint:left knee pain.  HPI: Ronald Whitaker, 62 y.o. male, has a history of pain and functional disability in the left knee due to arthritis and has failed non-surgical conservative treatments for greater than 12 weeks to includeNSAID's and/or analgesics and activity modification.  Onset of symptoms was gradual, starting 2 years ago with gradually worsening course since that time. The patient noted no past surgery on the left knee(s).  Patient currently rates pain in the left knee(s) at 8 out of 10 with activity. Patient has worsening of pain with activity and weight bearing, pain that interferes with activities of daily living, and pain with passive range of motion.  Patient has evidence of joint space narrowing by imaging studies. There is no active infection.  There are no problems to display for this patient.  Past Medical History:  Diagnosis Date   Arthritis    Atrial fibrillation (HCC)    Diabetes mellitus without complication (HCC)    Gout    Hypertension    Hypothyroidism    MI, old    Thyroid disease    hypothyroid    Past Surgical History:  Procedure Laterality Date   BACK SURGERY     CARDIAC CATHETERIZATION     CATARACT EXTRACTION W/ INTRAOCULAR LENS IMPLANT Bilateral    CORONARY ARTERY BYPASS GRAFT     2014    No current facility-administered medications for this encounter.   Current Outpatient Medications  Medication Sig Dispense Refill Last Dose   acetaminophen (TYLENOL) 500 MG tablet Take 1,000 mg by mouth every 6 (six) hours as needed for mild pain or headache.      allopurinol (ZYLOPRIM) 100 MG tablet Take 100 mg by mouth daily.      amLODipine (NORVASC) 10 MG tablet Take 10 mg by mouth at bedtime.      apixaban (ELIQUIS) 5 MG TABS tablet Take 5 mg by mouth 2 (two) times daily.      atorvastatin (LIPITOR) 40 MG tablet Take 40 mg by mouth at bedtime.       benazepril (LOTENSIN) 20 MG tablet Take 30 mg by mouth daily.      dicyclomine (BENTYL) 10 MG capsule Take 10 mg by mouth 4 (four) times daily as needed (for abdominal pain).      dofetilide (TIKOSYN) 500 MCG capsule Take 500 mcg by mouth 2 (two) times daily.      dorzolamide-timolol (COSOPT) 22.3-6.8 MG/ML ophthalmic solution Place 1 drop into both eyes 2 (two) times daily.      latanoprost (XALATAN) 0.005 % ophthalmic solution Place 1 drop into both eyes at bedtime.      levothyroxine (SYNTHROID) 137 MCG tablet Take 137 mcg by mouth daily before breakfast.      metFORMIN (GLUCOPHAGE) 500 MG tablet Take 1,000 mg by mouth 2 (two) times daily with a meal.      metoprolol tartrate (LOPRESSOR) 25 MG tablet Take 12.5 mg by mouth 2 (two) times daily.      Multiple Vitamin (MULTIVITAMIN WITH MINERALS) TABS tablet Take 1 tablet by mouth daily.      pantoprazole (PROTONIX) 40 MG tablet Take 40 mg by mouth daily.      TRESIBA FLEXTOUCH 100 UNIT/ML FlexTouch Pen Inject 32 Units into the skin every evening.      valACYclovir (VALTREX) 500 MG tablet Take 500-1,000 mg by mouth daily as needed (Flare-up).  Allergies  Allergen Reactions   Beef Allergy Anaphylaxis and Hives   Pork Allergy Anaphylaxis    Pt has alphagal, rxn to pork and red meat, delayed anaphylaxis   Colchicine Nausea And Vomiting    Social History   Tobacco Use   Smoking status: Never   Smokeless tobacco: Never  Substance Use Topics   Alcohol use: No    No family history on file.   Review of Systems  Constitutional:  Negative for chills and fever.  Respiratory:  Negative for cough and shortness of breath.   Cardiovascular:  Negative for chest pain.  Gastrointestinal:  Negative for nausea and vomiting.  Musculoskeletal:  Positive for arthralgias.     Objective:  Physical Exam Well nourished and well developed. General: Alert and oriented x3, cooperative and pleasant, no acute distress. Head: normocephalic,  atraumatic, neck supple. Eyes: EOMI.  Musculoskeletal: Left knee exam: Obvious genu varum associated with about a 5 degree flexion contracture Flexion of 110 degrees with tightness and crepitation over the anterior medial aspect the knee Stable medial and lateral collateral ligaments with noted pseudolaxity medially No significant lower extremity edema, erythema or calf tenderness  Calves soft and nontender. Motor function intact in LE. Strength 5/5 LE bilaterally. Neuro: Distal pulses 2+. Sensation to light touch intact in LE.  Vital signs in last 24 hours:    Labs:   Estimated body mass index is 31.93 kg/m as calculated from the following:   Height as of 04/11/23: 5\' 9"  (1.753 m).   Weight as of 04/11/23: 98.1 kg.   Imaging Review Plain radiographs demonstrate severe degenerative joint disease of the left knee(s). The overall alignment isneutral. The bone quality appears to be adequate for age and reported activity level.      Assessment/Plan:  End stage arthritis, left knee   The patient history, physical examination, clinical judgment of the provider and imaging studies are consistent with end stage degenerative joint disease of the left knee(s) and total knee arthroplasty is deemed medically necessary. The treatment options including medical management, injection therapy arthroscopy and arthroplasty were discussed at length. The risks and benefits of total knee arthroplasty were presented and reviewed. The risks due to aseptic loosening, infection, stiffness, patella tracking problems, thromboembolic complications and other imponderables were discussed. The patient acknowledged the explanation, agreed to proceed with the plan and consent was signed. Patient is being admitted for inpatient treatment for surgery, pain control, PT, OT, prophylactic antibiotics, VTE prophylaxis, progressive ambulation and ADL's and discharge planning. The patient is planning to be discharged   home.  Therapy Plans: outpatient [facility] at [facility] Disposition: Home with wife Planned DVT Prophylaxis: Eliquis 5 mg BID DME needed: none PCP: Kellie Furin, clearance received Cardiologist: Dr. Shelby Dubin, clearance received TXA: IV Allergies: NKDA Anesthesia Concerns: none BMI: 32.5 Last HgbA1c: 7.0%   Other: - oxycodone, tylenol, tizanidine - No hx of VTE or cancer - ice machine **     Patient's anticipated LOS is less than 2 midnights, meeting these requirements: - Younger than 11 - Lives within 1 hour of care - Has a competent adult at home to recover with post-op recover - NO history of  - Chronic pain requiring opiods  - Diabetes  - Coronary Artery Disease  - Heart failure  - Heart attack  - Stroke  - DVT/VTE  - Cardiac arrhythmia  - Respiratory Failure/COPD  - Renal failure  - Anemia  - Advanced Liver disease  Rosalene Billings, PA-C Orthopedic Surgery [facility]  [place] [phone]

## 2023-04-22 ENCOUNTER — Encounter (HOSPITAL_COMMUNITY): Payer: Self-pay | Admitting: Orthopedic Surgery

## 2023-04-22 ENCOUNTER — Ambulatory Visit (HOSPITAL_COMMUNITY): Payer: Medicare HMO | Admitting: Physician Assistant

## 2023-04-22 ENCOUNTER — Other Ambulatory Visit: Payer: Self-pay

## 2023-04-22 ENCOUNTER — Encounter (HOSPITAL_COMMUNITY)
Admission: RE | Disposition: A | Discharge status: Home / Self Care | Payer: Self-pay | Source: Ambulatory Visit | Attending: Orthopedic Surgery

## 2023-04-22 ENCOUNTER — Observation Stay (HOSPITAL_COMMUNITY)
Admission: RE | Admit: 2023-04-22 | Discharge: 2023-04-23 | Disposition: A | Discharge status: Home / Self Care | Payer: Medicare HMO | Source: Ambulatory Visit | Attending: Orthopedic Surgery | Admitting: Orthopedic Surgery

## 2023-04-22 ENCOUNTER — Ambulatory Visit (HOSPITAL_BASED_OUTPATIENT_CLINIC_OR_DEPARTMENT_OTHER): Payer: Medicare HMO | Admitting: Anesthesiology

## 2023-04-22 DIAGNOSIS — Z79899 Other long term (current) drug therapy: Secondary | ICD-10-CM | POA: Diagnosis not present

## 2023-04-22 DIAGNOSIS — E039 Hypothyroidism, unspecified: Secondary | ICD-10-CM | POA: Diagnosis not present

## 2023-04-22 DIAGNOSIS — Z7901 Long term (current) use of anticoagulants: Secondary | ICD-10-CM | POA: Diagnosis not present

## 2023-04-22 DIAGNOSIS — I4891 Unspecified atrial fibrillation: Secondary | ICD-10-CM | POA: Diagnosis not present

## 2023-04-22 DIAGNOSIS — Z951 Presence of aortocoronary bypass graft: Secondary | ICD-10-CM | POA: Diagnosis not present

## 2023-04-22 DIAGNOSIS — M659 Synovitis and tenosynovitis, unspecified: Secondary | ICD-10-CM

## 2023-04-22 DIAGNOSIS — E119 Type 2 diabetes mellitus without complications: Secondary | ICD-10-CM | POA: Insufficient documentation

## 2023-04-22 DIAGNOSIS — M1712 Unilateral primary osteoarthritis, left knee: Secondary | ICD-10-CM | POA: Diagnosis present

## 2023-04-22 DIAGNOSIS — Z7984 Long term (current) use of oral hypoglycemic drugs: Secondary | ICD-10-CM | POA: Insufficient documentation

## 2023-04-22 DIAGNOSIS — M25762 Osteophyte, left knee: Secondary | ICD-10-CM | POA: Diagnosis not present

## 2023-04-22 DIAGNOSIS — M25462 Effusion, left knee: Secondary | ICD-10-CM | POA: Diagnosis not present

## 2023-04-22 DIAGNOSIS — Z96652 Presence of left artificial knee joint: Secondary | ICD-10-CM

## 2023-04-22 DIAGNOSIS — I10 Essential (primary) hypertension: Secondary | ICD-10-CM | POA: Insufficient documentation

## 2023-04-22 HISTORY — PX: TOTAL KNEE ARTHROPLASTY: SHX125

## 2023-04-22 LAB — GLUCOSE, CAPILLARY
Glucose-Capillary: 131 mg/dL — ABNORMAL HIGH (ref 70–99)
Glucose-Capillary: 128 mg/dL — ABNORMAL HIGH (ref 70–99)
Glucose-Capillary: 188 mg/dL — ABNORMAL HIGH (ref 70–99)
Glucose-Capillary: 226 mg/dL — ABNORMAL HIGH (ref 70–99)
Glucose-Capillary: 250 mg/dL — ABNORMAL HIGH (ref 70–99)

## 2023-04-22 SURGERY — ARTHROPLASTY, KNEE, TOTAL
Anesthesia: Monitor Anesthesia Care | Site: Knee | Laterality: Left

## 2023-04-22 MED ORDER — MIDAZOLAM HCL 5 MG/5ML IJ SOLN
INTRAMUSCULAR | Status: DC | PRN
  Administered 2023-04-22 (×2): 1 mg via INTRAVENOUS

## 2023-04-22 MED ORDER — ONDANSETRON HCL 4 MG PO TABS: 4.0000 mg | ORAL_TABLET | Freq: Four times a day (QID) | ORAL | Status: DC | PRN

## 2023-04-22 MED ORDER — ONDANSETRON HCL 4 MG/2ML IJ SOLN
INTRAMUSCULAR | Status: AC
  Filled 2023-04-22: qty 2

## 2023-04-22 MED ORDER — DOCUSATE SODIUM 100 MG PO CAPS
100.0000 mg | ORAL_CAPSULE | Freq: Two times a day (BID) | ORAL | Status: DC
  Administered 2023-04-22 – 2023-04-23 (×2): 100 mg via ORAL
  Filled 2023-04-22 (×2): qty 1

## 2023-04-22 MED ORDER — SODIUM CHLORIDE (PF) 0.9 % IJ SOLN
INTRAMUSCULAR | Status: AC
  Filled 2023-04-22: qty 30

## 2023-04-22 MED ORDER — OXYCODONE HCL 5 MG PO TABS
ORAL_TABLET | ORAL | Status: AC
  Administered 2023-04-22: 5 mg via ORAL
  Filled 2023-04-22: qty 1

## 2023-04-22 MED ORDER — PROPOFOL 1000 MG/100ML IV EMUL
INTRAVENOUS | Status: AC
  Filled 2023-04-22: qty 100

## 2023-04-22 MED ORDER — AMLODIPINE BESYLATE 10 MG PO TABS
10.0000 mg | ORAL_TABLET | Freq: Every day | ORAL | Status: DC
  Administered 2023-04-22: 10 mg via ORAL
  Filled 2023-04-22: qty 1

## 2023-04-22 MED ORDER — DORZOLAMIDE HCL-TIMOLOL MAL 2-0.5 % OP SOLN
1.0000 [drp] | Freq: Two times a day (BID) | OPHTHALMIC | Status: DC
  Administered 2023-04-22 – 2023-04-23 (×2): 1 [drp] via OPHTHALMIC
  Filled 2023-04-22: qty 10

## 2023-04-22 MED ORDER — EPHEDRINE 5 MG/ML INJ
INTRAVENOUS | Status: AC
  Filled 2023-04-22: qty 5

## 2023-04-22 MED ORDER — BUPIVACAINE HCL (PF) 0.25 % IJ SOLN
INTRAMUSCULAR | Status: AC
  Filled 2023-04-22: qty 30

## 2023-04-22 MED ORDER — PROMETHAZINE HCL 25 MG/ML IJ SOLN: 6.2500 mg | INTRAMUSCULAR | Status: DC | PRN

## 2023-04-22 MED ORDER — OXYCODONE HCL 5 MG PO TABS
5.0000 mg | ORAL_TABLET | Freq: Once | ORAL | Status: AC | PRN
  Administered 2023-04-22: 5 mg via ORAL

## 2023-04-22 MED ORDER — ONDANSETRON HCL 4 MG/2ML IJ SOLN: 4.0000 mg | Freq: Four times a day (QID) | INTRAMUSCULAR | Status: DC | PRN

## 2023-04-22 MED ORDER — HYDROMORPHONE HCL 1 MG/ML IJ SOLN: 0.2500 mg | INTRAMUSCULAR | Status: DC | PRN

## 2023-04-22 MED ORDER — OXYCODONE HCL 5 MG PO TABS
10.0000 mg | ORAL_TABLET | ORAL | Status: DC | PRN
  Administered 2023-04-22 (×2): 10 mg via ORAL
  Administered 2023-04-22 – 2023-04-23 (×2): 15 mg via ORAL
  Filled 2023-04-22: qty 2
  Filled 2023-04-22 (×2): qty 3

## 2023-04-22 MED ORDER — HYDROMORPHONE HCL 1 MG/ML IJ SOLN
0.5000 mg | INTRAMUSCULAR | Status: DC | PRN
  Administered 2023-04-22: 1 mg via INTRAVENOUS
  Filled 2023-04-22: qty 1

## 2023-04-22 MED ORDER — ORAL CARE MOUTH RINSE: 15.0000 mL | Freq: Once | OROMUCOSAL | Status: AC

## 2023-04-22 MED ORDER — DEXAMETHASONE SODIUM PHOSPHATE 10 MG/ML IJ SOLN
INTRAMUSCULAR | Status: AC
  Filled 2023-04-22: qty 1

## 2023-04-22 MED ORDER — POLYETHYLENE GLYCOL 3350 17 G PO PACK
17.0000 g | PACK | Freq: Two times a day (BID) | ORAL | Status: DC
  Administered 2023-04-22 – 2023-04-23 (×2): 17 g via ORAL
  Filled 2023-04-22 (×2): qty 1

## 2023-04-22 MED ORDER — DEXAMETHASONE SODIUM PHOSPHATE 10 MG/ML IJ SOLN
8.0000 mg | Freq: Once | INTRAMUSCULAR | Status: AC
  Administered 2023-04-22: 8 mg via INTRAVENOUS

## 2023-04-22 MED ORDER — OXYCODONE HCL 5 MG PO TABS
5.0000 mg | ORAL_TABLET | ORAL | Status: DC | PRN
  Filled 2023-04-22: qty 2

## 2023-04-22 MED ORDER — LACTATED RINGERS IV SOLN: INTRAVENOUS | Status: DC

## 2023-04-22 MED ORDER — CHLORHEXIDINE GLUCONATE 0.12 % MT SOLN
15.0000 mL | Freq: Once | OROMUCOSAL | Status: AC
  Administered 2023-04-22: 15 mL via OROMUCOSAL

## 2023-04-22 MED ORDER — PHENYLEPHRINE 80 MCG/ML (10ML) SYRINGE FOR IV PUSH (FOR BLOOD PRESSURE SUPPORT)
PREFILLED_SYRINGE | INTRAVENOUS | Status: DC | PRN
  Administered 2023-04-22: 80 ug via INTRAVENOUS
  Administered 2023-04-22 (×4): 160 ug via INTRAVENOUS

## 2023-04-22 MED ORDER — OXYCODONE HCL 5 MG PO TABS
ORAL_TABLET | ORAL | Status: AC
  Filled 2023-04-22: qty 1

## 2023-04-22 MED ORDER — ACETAMINOPHEN 325 MG PO TABS: 325.0000 mg | ORAL_TABLET | Freq: Four times a day (QID) | ORAL | Status: DC | PRN

## 2023-04-22 MED ORDER — CEFAZOLIN SODIUM-DEXTROSE 2-4 GM/100ML-% IV SOLN
2.0000 g | Freq: Four times a day (QID) | INTRAVENOUS | Status: AC
  Administered 2023-04-22 (×2): 2 g via INTRAVENOUS
  Filled 2023-04-22 (×2): qty 100

## 2023-04-22 MED ORDER — TIZANIDINE HCL 4 MG PO TABS
2.0000 mg | ORAL_TABLET | Freq: Three times a day (TID) | ORAL | Status: DC | PRN
  Administered 2023-04-22: 2 mg via ORAL
  Filled 2023-04-22: qty 1

## 2023-04-22 MED ORDER — EPHEDRINE SULFATE-NACL 50-0.9 MG/10ML-% IV SOSY
PREFILLED_SYRINGE | INTRAVENOUS | Status: DC | PRN
  Administered 2023-04-22: 5 mg via INTRAVENOUS
  Administered 2023-04-22: 10 mg via INTRAVENOUS
  Administered 2023-04-22 (×2): 5 mg via INTRAVENOUS

## 2023-04-22 MED ORDER — LEVOTHYROXINE SODIUM 25 MCG PO TABS
137.0000 ug | ORAL_TABLET | Freq: Every day | ORAL | Status: DC
  Administered 2023-04-23: 137 ug via ORAL
  Filled 2023-04-22: qty 1

## 2023-04-22 MED ORDER — SODIUM CHLORIDE (PF) 0.9 % IJ SOLN
INTRAMUSCULAR | Status: DC | PRN
  Administered 2023-04-22: 30 mL

## 2023-04-22 MED ORDER — TRANEXAMIC ACID-NACL 1000-0.7 MG/100ML-% IV SOLN
1000.0000 mg | Freq: Once | INTRAVENOUS | Status: AC
  Administered 2023-04-22: 1000 mg via INTRAVENOUS
  Filled 2023-04-22: qty 100

## 2023-04-22 MED ORDER — ACETAMINOPHEN 500 MG PO TABS
1000.0000 mg | ORAL_TABLET | Freq: Four times a day (QID) | ORAL | Status: AC
  Administered 2023-04-22 – 2023-04-23 (×4): 1000 mg via ORAL
  Filled 2023-04-22 (×4): qty 2

## 2023-04-22 MED ORDER — PROPOFOL 10 MG/ML IV BOLUS
INTRAVENOUS | Status: DC | PRN
  Administered 2023-04-22: 20 mg via INTRAVENOUS
  Administered 2023-04-22: 40 mg via INTRAVENOUS
  Administered 2023-04-22 (×2): 20 mg via INTRAVENOUS

## 2023-04-22 MED ORDER — METOCLOPRAMIDE HCL 5 MG/ML IJ SOLN: 5.0000 mg | Freq: Three times a day (TID) | INTRAMUSCULAR | Status: DC | PRN

## 2023-04-22 MED ORDER — ALLOPURINOL 100 MG PO TABS
100.0000 mg | ORAL_TABLET | Freq: Every day | ORAL | Status: DC
  Administered 2023-04-23: 100 mg via ORAL
  Filled 2023-04-22: qty 1

## 2023-04-22 MED ORDER — MENTHOL 3 MG MT LOZG: 1.0000 | LOZENGE | OROMUCOSAL | Status: DC | PRN

## 2023-04-22 MED ORDER — BENAZEPRIL HCL 20 MG PO TABS
30.0000 mg | ORAL_TABLET | Freq: Every day | ORAL | Status: DC
  Administered 2023-04-23: 30 mg via ORAL
  Filled 2023-04-22: qty 2

## 2023-04-22 MED ORDER — BISACODYL 10 MG RE SUPP: 10.0000 mg | Freq: Every day | RECTAL | Status: DC | PRN

## 2023-04-22 MED ORDER — 0.9 % SODIUM CHLORIDE (POUR BTL) OPTIME
TOPICAL | Status: DC | PRN
  Administered 2023-04-22: 1000 mL

## 2023-04-22 MED ORDER — DEXAMETHASONE SODIUM PHOSPHATE 10 MG/ML IJ SOLN
10.0000 mg | Freq: Once | INTRAMUSCULAR | Status: AC
  Administered 2023-04-23: 10 mg via INTRAVENOUS
  Filled 2023-04-22: qty 1

## 2023-04-22 MED ORDER — EPINEPHRINE PF 1 MG/ML IJ SOLN
INTRAMUSCULAR | Status: AC
  Filled 2023-04-22: qty 1

## 2023-04-22 MED ORDER — PHENOL 1.4 % MT LIQD: 1.0000 | OROMUCOSAL | Status: DC | PRN

## 2023-04-22 MED ORDER — DICYCLOMINE HCL 10 MG PO CAPS: 10.0000 mg | ORAL_CAPSULE | Freq: Four times a day (QID) | ORAL | Status: DC | PRN

## 2023-04-22 MED ORDER — INSULIN GLARGINE-YFGN 100 UNIT/ML ~~LOC~~ SOLN
32.0000 [IU] | Freq: Every evening | SUBCUTANEOUS | Status: DC
  Filled 2023-04-22: qty 0.32

## 2023-04-22 MED ORDER — SODIUM CHLORIDE 0.9 % IV SOLN: INTRAVENOUS | Status: DC

## 2023-04-22 MED ORDER — APIXABAN 5 MG PO TABS
5.0000 mg | ORAL_TABLET | Freq: Two times a day (BID) | ORAL | Status: DC
  Administered 2023-04-23: 5 mg via ORAL
  Filled 2023-04-22: qty 1

## 2023-04-22 MED ORDER — KETOROLAC TROMETHAMINE 30 MG/ML IJ SOLN
INTRAMUSCULAR | Status: AC
  Filled 2023-04-22: qty 1

## 2023-04-22 MED ORDER — PHENYLEPHRINE 80 MCG/ML (10ML) SYRINGE FOR IV PUSH (FOR BLOOD PRESSURE SUPPORT)
PREFILLED_SYRINGE | INTRAVENOUS | Status: AC
  Filled 2023-04-22: qty 10

## 2023-04-22 MED ORDER — ROPIVACAINE HCL 5 MG/ML IJ SOLN
INTRAMUSCULAR | Status: DC | PRN
  Administered 2023-04-22: 20 mL via PERINEURAL

## 2023-04-22 MED ORDER — ORAL CARE MOUTH RINSE: 15.0000 mL | OROMUCOSAL | Status: DC | PRN

## 2023-04-22 MED ORDER — POVIDONE-IODINE 10 % EX SWAB: 2.0000 | Freq: Once | CUTANEOUS | Status: AC

## 2023-04-22 MED ORDER — POLYETHYLENE GLYCOL 3350 17 G PO PACK: 17.0000 g | PACK | Freq: Two times a day (BID) | ORAL | Status: DC

## 2023-04-22 MED ORDER — METOCLOPRAMIDE HCL 5 MG PO TABS: 5.0000 mg | ORAL_TABLET | Freq: Three times a day (TID) | ORAL | Status: DC | PRN

## 2023-04-22 MED ORDER — BUPIVACAINE IN DEXTROSE 0.75-8.25 % IT SOLN
INTRATHECAL | Status: DC | PRN
  Administered 2023-04-22: 1.4 mL via INTRATHECAL

## 2023-04-22 MED ORDER — METOPROLOL TARTRATE 12.5 MG HALF TABLET
12.5000 mg | ORAL_TABLET | Freq: Two times a day (BID) | ORAL | Status: DC
  Administered 2023-04-22 – 2023-04-23 (×2): 12.5 mg via ORAL
  Filled 2023-04-22 (×2): qty 1

## 2023-04-22 MED ORDER — MIDAZOLAM HCL 2 MG/2ML IJ SOLN
INTRAMUSCULAR | Status: AC
  Filled 2023-04-22: qty 2

## 2023-04-22 MED ORDER — FENTANYL CITRATE (PF) 100 MCG/2ML IJ SOLN
INTRAMUSCULAR | Status: AC
  Filled 2023-04-22: qty 2

## 2023-04-22 MED ORDER — ATORVASTATIN CALCIUM 40 MG PO TABS
40.0000 mg | ORAL_TABLET | Freq: Every day | ORAL | Status: DC
  Administered 2023-04-22: 40 mg via ORAL
  Filled 2023-04-22: qty 1

## 2023-04-22 MED ORDER — LATANOPROST 0.005 % OP SOLN
1.0000 [drp] | Freq: Every day | OPHTHALMIC | Status: DC
  Administered 2023-04-22: 1 [drp] via OPHTHALMIC
  Filled 2023-04-22: qty 2.5

## 2023-04-22 MED ORDER — OXYCODONE HCL 5 MG/5ML PO SOLN: 5.0000 mg | Freq: Once | ORAL | Status: AC | PRN

## 2023-04-22 MED ORDER — CEFAZOLIN SODIUM-DEXTROSE 2-4 GM/100ML-% IV SOLN
2.0000 g | INTRAVENOUS | Status: AC
  Administered 2023-04-22: 2 g via INTRAVENOUS
  Filled 2023-04-22: qty 100

## 2023-04-22 MED ORDER — ASPIRIN 81 MG PO CHEW
81.0000 mg | CHEWABLE_TABLET | Freq: Two times a day (BID) | ORAL | Status: DC
Start: 2023-04-22 — End: 2023-04-22

## 2023-04-22 MED ORDER — ONDANSETRON HCL 4 MG/2ML IJ SOLN
INTRAMUSCULAR | Status: DC | PRN
  Administered 2023-04-22: 4 mg via INTRAVENOUS

## 2023-04-22 MED ORDER — SODIUM CHLORIDE 0.9 % IR SOLN
Status: DC | PRN
  Administered 2023-04-22 (×2): 1000 mL

## 2023-04-22 MED ORDER — PROPOFOL 500 MG/50ML IV EMUL
INTRAVENOUS | Status: DC | PRN
  Administered 2023-04-22: 125 ug/kg/min via INTRAVENOUS

## 2023-04-22 MED ORDER — PANTOPRAZOLE SODIUM 40 MG PO TBEC
40.0000 mg | DELAYED_RELEASE_TABLET | Freq: Every day | ORAL | Status: DC
  Administered 2023-04-23: 40 mg via ORAL
  Filled 2023-04-22: qty 1

## 2023-04-22 MED ORDER — PROPOFOL 10 MG/ML IV BOLUS
INTRAVENOUS | Status: AC
  Filled 2023-04-22: qty 20

## 2023-04-22 MED ORDER — FENTANYL CITRATE (PF) 100 MCG/2ML IJ SOLN
INTRAMUSCULAR | Status: DC | PRN
  Administered 2023-04-22 (×2): 50 ug via INTRAVENOUS

## 2023-04-22 MED ORDER — TRANEXAMIC ACID-NACL 1000-0.7 MG/100ML-% IV SOLN
1000.0000 mg | INTRAVENOUS | Status: AC
  Administered 2023-04-22: 1000 mg via INTRAVENOUS
  Filled 2023-04-22: qty 100

## 2023-04-22 MED ORDER — INSULIN ASPART 100 UNIT/ML IJ SOLN
0.0000 [IU] | Freq: Three times a day (TID) | INTRAMUSCULAR | Status: DC
  Administered 2023-04-22: 3 [IU] via SUBCUTANEOUS
  Administered 2023-04-22: 5 [IU] via SUBCUTANEOUS
  Administered 2023-04-23: 3 [IU] via SUBCUTANEOUS
  Administered 2023-04-23: 2 [IU] via SUBCUTANEOUS

## 2023-04-22 MED ORDER — DIPHENHYDRAMINE HCL 12.5 MG/5ML PO ELIX: 12.5000 mg | ORAL_SOLUTION | ORAL | Status: DC | PRN

## 2023-04-22 MED ORDER — METFORMIN HCL 500 MG PO TABS
1000.0000 mg | ORAL_TABLET | Freq: Two times a day (BID) | ORAL | Status: DC
  Administered 2023-04-22 – 2023-04-23 (×2): 1000 mg via ORAL
  Filled 2023-04-22 (×2): qty 2

## 2023-04-22 MED ORDER — DOFETILIDE 250 MCG PO CAPS
500.0000 ug | ORAL_CAPSULE | Freq: Two times a day (BID) | ORAL | Status: DC
  Administered 2023-04-22 – 2023-04-23 (×3): 500 ug via ORAL
  Filled 2023-04-22 (×3): qty 2

## 2023-04-22 MED ORDER — KETOROLAC TROMETHAMINE 30 MG/ML IJ SOLN
INTRAMUSCULAR | Status: DC | PRN
  Administered 2023-04-22: 30 mg via INTRAMUSCULAR

## 2023-04-22 MED ORDER — BUPIVACAINE-EPINEPHRINE (PF) 0.25% -1:200000 IJ SOLN
INTRAMUSCULAR | Status: DC | PRN
  Administered 2023-04-22: 30 mL

## 2023-04-22 SURGICAL SUPPLY — 58 items
ADH SKN CLS APL DERMABOND .7 (GAUZE/BANDAGES/DRESSINGS) ×1
ATTUNE MED ANAT PAT 38 KNEE (Knees) IMPLANT
ATTUNE PS FEM LT SZ 7 CEM KNEE (Femur) IMPLANT
ATTUNE PSRP INSR SZ7 6 KNEE (Insert) IMPLANT
BAG COUNTER SPONGE SURGICOUNT (BAG) IMPLANT
BAG SPEC THK2 15X12 ZIP CLS (MISCELLANEOUS)
BAG SPNG CNTER NS LX DISP (BAG)
BAG ZIPLOCK 12X15 (MISCELLANEOUS) IMPLANT
BASE TIBIAL ROT PLAT SZ 8 KNEE (Knees) IMPLANT
BLADE SAW SGTL 11.0X1.19X90.0M (BLADE) IMPLANT
BLADE SAW SGTL 13.0X1.19X90.0M (BLADE) ×1 IMPLANT
BNDG CMPR 5X62 HK CLSR LF (GAUZE/BANDAGES/DRESSINGS) ×1
BNDG CMPR MED 10X6 ELC LF (GAUZE/BANDAGES/DRESSINGS) ×1
BNDG ELASTIC 6INX 5YD STR LF (GAUZE/BANDAGES/DRESSINGS) ×1 IMPLANT
BNDG ELASTIC 6X10 VLCR STRL LF (GAUZE/BANDAGES/DRESSINGS) IMPLANT
BOWL SMART MIX CTS (DISPOSABLE) ×1 IMPLANT
BSPLAT TIB 8 CMNT ROT PLAT STR (Knees) ×1 IMPLANT
CEMENT HV SMART SET (Cement) IMPLANT
COVER SURGICAL LIGHT HANDLE (MISCELLANEOUS) ×1 IMPLANT
CUFF TOURN SGL QUICK 34 (TOURNIQUET CUFF) ×1
CUFF TRNQT CYL 34X4.125X (TOURNIQUET CUFF) ×1 IMPLANT
DERMABOND ADVANCED .7 DNX12 (GAUZE/BANDAGES/DRESSINGS) ×1 IMPLANT
DRAPE U-SHAPE 47X51 STRL (DRAPES) ×1 IMPLANT
DRESSING AQUACEL AG SP 3.5X10 (GAUZE/BANDAGES/DRESSINGS) ×1 IMPLANT
DRSG AQUACEL AG ADV 3.5X10 (GAUZE/BANDAGES/DRESSINGS) IMPLANT
DRSG AQUACEL AG SP 3.5X10 (GAUZE/BANDAGES/DRESSINGS) ×1
DURAPREP 26ML APPLICATOR (WOUND CARE) ×2 IMPLANT
ELECT REM PT RETURN 15FT ADLT (MISCELLANEOUS) ×1 IMPLANT
GLOVE BIO SURGEON STRL SZ 6 (GLOVE) ×1 IMPLANT
GLOVE BIOGEL PI IND STRL 6.5 (GLOVE) ×1 IMPLANT
GLOVE BIOGEL PI IND STRL 7.5 (GLOVE) ×1 IMPLANT
GLOVE ORTHO TXT STRL SZ7.5 (GLOVE) ×2 IMPLANT
GOWN STRL REUS W/ TWL LRG LVL3 (GOWN DISPOSABLE) ×2 IMPLANT
GOWN STRL REUS W/TWL LRG LVL3 (GOWN DISPOSABLE) ×2
HANDPIECE INTERPULSE COAX TIP (DISPOSABLE) ×1
HOLDER FOLEY CATH W/STRAP (MISCELLANEOUS) IMPLANT
KIT TURNOVER KIT A (KITS) IMPLANT
MANIFOLD NEPTUNE II (INSTRUMENTS) ×1 IMPLANT
NDL SAFETY ECLIP 18X1.5 (MISCELLANEOUS) IMPLANT
NS IRRIG 1000ML POUR BTL (IV SOLUTION) ×1 IMPLANT
PACK TOTAL KNEE CUSTOM (KITS) ×1 IMPLANT
PIN FIX SIGMA LCS THRD HI (PIN) IMPLANT
PROTECTOR NERVE ULNAR (MISCELLANEOUS) ×1 IMPLANT
SET HNDPC FAN SPRY TIP SCT (DISPOSABLE) ×1 IMPLANT
SET PAD KNEE POSITIONER (MISCELLANEOUS) ×1 IMPLANT
SPIKE FLUID TRANSFER (MISCELLANEOUS) ×2 IMPLANT
SUT MNCRL AB 4-0 PS2 18 (SUTURE) ×1 IMPLANT
SUT STRATAFIX PDS+ 0 24IN (SUTURE) ×1 IMPLANT
SUT VIC AB 1 CT1 36 (SUTURE) ×1 IMPLANT
SUT VIC AB 2-0 CT1 27 (SUTURE) ×2
SUT VIC AB 2-0 CT1 TAPERPNT 27 (SUTURE) ×2 IMPLANT
SYR 3ML LL SCALE MARK (SYRINGE) ×1 IMPLANT
TIBIAL BASE ROT PLAT SZ 8 KNEE (Knees) ×1 IMPLANT
TOWEL GREEN STERILE FF (TOWEL DISPOSABLE) ×1 IMPLANT
TRAY FOLEY MTR SLVR 16FR STAT (SET/KITS/TRAYS/PACK) ×1 IMPLANT
TUBE SUCTION HIGH CAP CLEAR NV (SUCTIONS) ×1 IMPLANT
WATER STERILE IRR 1000ML POUR (IV SOLUTION) ×2 IMPLANT
WRAP KNEE MAXI GEL POST OP (GAUZE/BANDAGES/DRESSINGS) ×1 IMPLANT

## 2023-04-22 NOTE — Anesthesia Postprocedure Evaluation (Signed)
Anesthesia Post Note  Patient: Ronald Whitaker  Procedure(s) Performed: TOTAL KNEE ARTHROPLASTY (Left: Knee)     Patient location during evaluation: PACU Anesthesia Type: MAC and Spinal Level of consciousness: awake and alert Pain management: pain level controlled Vital Signs Assessment: post-procedure vital signs reviewed and stable Respiratory status: spontaneous breathing, nonlabored ventilation and respiratory function stable Cardiovascular status: blood pressure returned to baseline and stable Postop Assessment: no apparent nausea or vomiting Anesthetic complications: no   No notable events documented.  Last Vitals:  Vitals:   04/22/23 1030 04/22/23 1035  BP: (!) 117/93   Pulse: (!) 59 (!) 57  Resp: 15 16  Temp:    SpO2: 100% 100%    Last Pain:  Vitals:   04/22/23 1030  TempSrc:   PainSc: 8                  Lowella Curb

## 2023-04-22 NOTE — Interval H&P Note (Signed)
History and Physical Interval Note:  04/22/2023 6:55 AM  Ronald Whitaker  has presented today for surgery, with the diagnosis of Left knee osteoarthritis.  The various methods of treatment have been discussed with the patient and family. After consideration of risks, benefits and other options for treatment, the patient has consented to  Procedure(s): TOTAL KNEE ARTHROPLASTY (Left) as a surgical intervention.  The patient's history has been reviewed, patient examined, no change in status, stable for surgery.  I have reviewed the patient's chart and labs.  Questions were answered to the patient's satisfaction.     Shelda Pal

## 2023-04-22 NOTE — Transfer of Care (Signed)
Immediate Anesthesia Transfer of Care Note  Patient: Ronald Whitaker  Procedure(s) Performed: TOTAL KNEE ARTHROPLASTY (Left: Knee)  Patient Location: PACU  Anesthesia Type:Regional and Spinal  Level of Consciousness: awake, alert , and oriented  Airway & Oxygen Therapy: Patient Spontanous Breathing and Patient connected to face mask oxygen  Post-op Assessment: Report given to RN and Post -op Vital signs reviewed and stable  Post vital signs: Reviewed and stable  Last Vitals:  Vitals Value Taken Time  BP 93/69 04/22/23 0908  Temp    Pulse 59 04/22/23 0909  Resp 13 04/22/23 0909  SpO2 95 % 04/22/23 0909  Vitals shown include unvalidated device data.  Last Pain:  Vitals:   04/22/23 0546  TempSrc: Oral         Complications: No notable events documented.

## 2023-04-22 NOTE — Discharge Instructions (Signed)

## 2023-04-22 NOTE — Anesthesia Procedure Notes (Signed)
Anesthesia Regional Block: Adductor canal block   Pre-Anesthetic Checklist: , timeout performed,  Correct Patient, Correct Site, Correct Laterality,  Correct Procedure, Correct Position, site marked,  Risks and benefits discussed,  Surgical consent,  Pre-op evaluation,  At surgeon's request and post-op pain management  Laterality: Left  Prep: chloraprep       Needles:  Injection technique: Single-shot  Needle Type: Stimiplex     Needle Length: 9cm  Needle Gauge: 21     Additional Needles:   Procedures:,,,, ultrasound used (permanent image in chart),,    Narrative:  Start time: 04/22/2023 6:58 AM End time: 04/22/2023 7:03 AM Injection made incrementally with aspirations every 5 mL.  Performed by: Personally  Anesthesiologist: Lowella Curb, MD

## 2023-04-22 NOTE — Op Note (Signed)
NAME:  Ronald Whitaker                      MEDICAL RECORD NO.:  [id number]                             FACILITY:  [facility]      PHYSICIAN:  Madlyn Frankel. Charlann Boxer, M.D.  DATE OF BIRTH:  [birth year]      DATE OF PROCEDURE:  04/22/2023                                     OPERATIVE REPORT         PREOPERATIVE DIAGNOSIS:  Left knee osteoarthritis.      POSTOPERATIVE DIAGNOSIS:  Left knee osteoarthritis.      FINDINGS:  The patient was noted to have complete loss of cartilage and   bone-on-bone arthritis with associated osteophytes in the medial and patellofemoral compartments of   the knee with a significant synovitis and associated effusion.  The patient had failed months of conservative treatment including medications, injection therapy, activity modification.     PROCEDURE:  Left total knee replacement.      COMPONENTS USED:  DePuy Attune rotating platform posterior stabilized knee   system, a size 7 femur, 8 tibia, size 6 mm PS AOX insert, and 38 anatomic patellar   button.      SURGEON:  Madlyn Frankel. Charlann Boxer, M.D.      ASSISTANT:  Rosalene Billings, PA-C.      ANESTHESIA:  Regional and Spinal.      SPECIMENS:  None.      COMPLICATION:  None.      DRAINS:  None.  EBL: <200cc      TOURNIQUET TIME:   Total Tourniquet Time Documented: Thigh (Left) - 41 minutes Total: Thigh (Left) - 41 minutes  .      The patient was stable to the recovery room.      INDICATION FOR PROCEDURE:  Ronald Whitaker is a 62 y.o. male patient of   mine.  The patient had been seen, evaluated, and treated for months conservatively in the   office with medication, activity modification, and injections.  The patient had   radiographic changes of bone-on-bone arthritis with endplate sclerosis and osteophytes noted.  Based on the radiographic changes and failed conservative measures, the patient   decided to proceed with definitive treatment, total knee replacement.  Risks of infection, DVT, component failure, need  for revision surgery, neurovascular injury were reviewed in the office setting.  The postop course was reviewed stressing the efforts to maximize post-operative satisfaction and function.  Consent was obtained for benefit of pain   relief.      PROCEDURE IN DETAIL:  The patient was brought to the operative theater.   Once adequate anesthesia, preoperative antibiotics, 2 gm of Ancef,1 gm of Tranexamic Acid, and 10 mg of Decadron administered, the patient was positioned supine with a left thigh tourniquet placed.  The  left lower extremity was prepped and draped in sterile fashion.  A time-   out was performed identifying the patient, planned procedure, and the appropriate extremity.      The left lower extremity was placed in the [facility] leg holder.  The leg was   exsanguinated, tourniquet elevated to 225 mmHg.  A midline incision was   made  followed by median parapatellar arthrotomy.  Following initial   exposure, attention was first directed to the patella.  Precut   measurement was noted to be 26-27 mm.  I resected down to 14-15 mm and used a   38 anatomic patellar button to restore patellar height as well as cover the cut surface.      The lug holes were drilled and a metal shim was placed to protect the   patella from retractors and saw blade during the procedure.      At this point, attention was now directed to the femur.  The femoral   canal was opened with a drill, irrigated to try to prevent fat emboli.  An   intramedullary rod was passed at 3 degrees valgus, 9 mm of bone was   resected off the distal femur.  Following this resection, the tibia was   subluxated anteriorly.  Using the extramedullary guide, 2 mm of bone was resected off   the proximal medial tibia.  We confirmed the gap would be   stable medially and laterally with a size 5 spacer block as well as confirmed that the tibial cut was perpendicular in the coronal plane, checking with an alignment rod.      Once this was  done, I sized the femur to be a size 7 in the anterior-   posterior dimension, chose a standard component based on medial and   lateral dimension.  The size 7 rotation block was then pinned in   position anterior referenced using the C-clamp to set rotation.  The   anterior, posterior, and  chamfer cuts were made without difficulty nor   notching making certain that I was along the anterior cortex to help   with flexion gap stability.      The final box cut was made off the lateral aspect of distal femur.      At this point, the tibia was sized to be a size 8.  The size 8 tray was   then pinned in position through the medial third of the tubercle,   drilled, and keel punched.  Trial reduction was now carried with a 7 femur,  8 tibia, a size 6 mm PS insert, and the 38 anatomic patella botton.  The knee was brought to full extension with good flexion stability with the patella   tracking through the trochlea without application of pressure.  Given   all these findings the trial components removed.  Final components were   opened and cement was mixed.  The knee was irrigated with normal saline solution and pulse lavage.  The synovial lining was   then injected with 30 cc of 0.25% Marcaine with epinephrine, 1 cc of Toradol and 30 cc of NS for a total of 61 cc.     Final implants were then cemented onto cleaned and dried cut surfaces of bone with the knee brought to extension with a size 6 mm PS trial insert.      Once the cement had fully cured, excess cement was removed   throughout the knee.  I confirmed that I was satisfied with the range of   motion and stability, and the final size 6 mm PS AOX insert was chosen.  It was   placed into the knee.      The tourniquet had been let down at 41 minutes.  No significant   hemostasis was required.  The extensor mechanism was then reapproximated using #1 Vicryl and #  1 Stratafix sutures with the knee   in flexion.  The   remaining wound was closed  with 2-0 Vicryl and running 4-0 Monocryl.   The knee was cleaned, dried, dressed sterilely using Dermabond and   Aquacel dressing.  The patient was then   brought to recovery room in stable condition, tolerating the procedure   well.   Please note that Physician Assistant, Rosalene Billings, PA-C was present for the entirety of the case, and was utilized for pre-operative positioning, peri-operative retractor management, general facilitation of the procedure and for primary wound closure at the end of the case.              Madlyn Frankel Charlann Boxer, M.D.    04/22/2023 8:40 AM

## 2023-04-22 NOTE — Evaluation (Signed)
Physical Therapy Evaluation Patient Details Name: Ronald Whitaker MRN: [id number] DOB: 1961/02/17 Today's Date: 04/22/2023  History of Present Illness  Pt is 62 yo male admitted on 04/22/23 for L TKA. Pt with hx including but not limited to arthritis, afib, DM, gout, HTN, MI, Back Surgery, CABG  Clinical Impression  Pt is s/p TKA resulting in the deficits listed below (see PT Problem List). At baseline, pt is independent, working, and enjoys Pharmacist, community.  He has DME and wife will be off work 2 weeks to assist at home.  Today, pt limited by some pain , as well as, still with decreased sensation L LE and decreased quad strength.  He compensated well with use of upper extremities ad R LE an was able to pivot to chair.  Pt expected to progress well with therapy. Pt will benefit from acute skilled PT to increase their independence and safety with mobility to allow discharge.       Recommendations for follow up therapy are one component of a multi-disciplinary discharge planning process, led by the attending physician.  Recommendations may be updated based on patient status, additional functional criteria and insurance authorization.  Follow Up Recommendations       Assistance Recommended at Discharge Intermittent Supervision/Assistance  Patient can return home with the following  A little help with walking and/or transfers;A little help with bathing/dressing/bathroom;Assistance with cooking/housework;Help with stairs or ramp for entrance    Equipment Recommendations None recommended by PT  Recommendations for Other Services       Functional Status Assessment Patient has had a recent decline in their functional status and demonstrates the ability to make significant improvements in function in a reasonable and predictable amount of time.     Precautions / Restrictions Precautions Precautions: Fall;Knee Restrictions Weight Bearing Restrictions: Yes LLE Weight Bearing: Weight bearing as  tolerated      Mobility  Bed Mobility Overal bed mobility: Needs Assistance Bed Mobility: Supine to Sit     Supine to sit: Supervision          Transfers Overall transfer level: Needs assistance Equipment used: Rolling walker (2 wheels) Transfers: Sit to/from Stand, Bed to chair/wheelchair/BSC Sit to Stand: Min assist   Step pivot transfers: Min assist       General transfer comment: LIght min A to steady, cues for hand placement and L LE management; not able to bear weight on L LE suspect due to block but compensates well with UE and able to pivot to chair on R LE    Ambulation/Gait                  Stairs            Wheelchair Mobility    Modified Rankin (Stroke Patients Only)       Balance Overall balance assessment: Needs assistance Sitting-balance support: No upper extremity supported Sitting balance-Leahy Scale: Good     Standing balance support: Bilateral upper extremity supported, Reliant on assistive device for balance Standing balance-Leahy Scale: Poor                               Pertinent Vitals/Pain Pain Assessment Pain Assessment: 0-10 Pain Score: 6  Pain Location: L knee Pain Descriptors / Indicators: Discomfort, Sore Pain Intervention(s): Limited activity within patient's tolerance, Monitored during session, Premedicated before session, Repositioned, Ice applied    Home Living Family/patient expects to be discharged to:: Private residence Living Arrangements:  Spouse/significant other Available Help at Discharge: Family;Available 24 hours/day (wife off for 2 weeks) Type of Home: House Home Access: Stairs to enter   [employer] of Steps: Back 5-6 steps with bil rails but close enough to reach ; Front and side -narrow and no rails   Home Layout: One level Home Equipment: Grab bars - tub/shower;Shower Counsellor (2 wheels);Cane - single point      Prior Function Prior Level of Function :  Independent/Modified Independent;Working/employed;Driving             Mobility Comments: likes to Arts development officer        Extremity/Trunk Assessment   Upper Extremity Assessment Upper Extremity Assessment: Overall WFL for tasks assessed (Strong UE)    Lower Extremity Assessment Lower Extremity Assessment: LLE deficits/detail;RLE deficits/detail RLE Deficits / Details: ROM WFL; MMT 5/5; sensation intact LLE Deficits / Details: ROM: knee ~5 to 80 degrees; MMT: akle 5/5, knee 1/5, hip 3/5 LLE Sensation: decreased light touch    Cervical / Trunk Assessment Cervical / Trunk Assessment: Normal  Communication   Communication: No difficulties  Cognition Arousal/Alertness: Awake/alert Behavior During Therapy: Impulsive Overall Cognitive Status: Within Functional Limits for tasks assessed                                 General Comments: Mild impulsiveness - cues to wait for instruction        General Comments      Exercises     Assessment/Plan    PT Assessment Patient needs continued PT services  PT Problem List Decreased strength;Pain;Decreased range of motion;Decreased activity tolerance;Decreased balance;Decreased mobility;Decreased knowledge of precautions;Decreased knowledge of use of DME;Decreased safety awareness       PT Treatment Interventions Therapeutic exercise;DME instruction;Gait training;Balance training;Stair training;Functional mobility training;Therapeutic activities;Patient/family education;Modalities    PT Goals (Current goals can be found in the Care Plan section)  Acute Rehab PT Goals Patient Stated Goal: return home PT Goal Formulation: With patient/family Time For Goal Achievement: 05/06/23 Potential to Achieve Goals: Good    Frequency 7X/week     Co-evaluation               AM-PAC PT "6 Clicks" Mobility  Outcome Measure Help needed turning from your back to your side while in a flat bed without using  bedrails?: A Little Help needed moving from lying on your back to sitting on the side of a flat bed without using bedrails?: A Little Help needed moving to and from a bed to a chair (including a wheelchair)?: A Little Help needed standing up from a chair using your arms (e.g., wheelchair or bedside chair)?: A Little Help needed to walk in hospital room?: A Lot Help needed climbing 3-5 steps with a railing? : A Lot 6 Click Score: 16    End of Session Equipment Utilized During Treatment: Gait belt Activity Tolerance: Patient tolerated treatment well Patient left: with chair alarm set;in chair;with call bell/phone within reach Nurse Communication: Mobility status PT Visit Diagnosis: Other abnormalities of gait and mobility (R26.89);Muscle weakness (generalized) (M62.81)    Time: 1610-9604 PT Time Calculation (min) (ACUTE ONLY): 27 min   Charges:   PT Evaluation $PT Eval Low Complexity: 1 Low PT Treatments $Therapeutic Activity: 8-22 mins        Anise Salvo, PT Acute Rehab [facility] [phone]   Rayetta Humphrey 04/22/2023, 3:09 PM

## 2023-04-22 NOTE — Anesthesia Procedure Notes (Signed)
Spinal  Patient location during procedure: OR Start time: 04/22/2023 7:10 AM End time: 04/22/2023 7:15 AM Reason for block: surgical anesthesia Staffing Performed: anesthesiologist  Anesthesiologist: Lowella Curb, MD Performed by: Lowella Curb, MD Authorized by: Lowella Curb, MD   Preanesthetic Checklist Completed: patient identified, IV checked, site marked, risks and benefits discussed, surgical consent, monitors and equipment checked, pre-op evaluation and timeout performed Spinal Block Patient position: sitting Prep: DuraPrep Patient monitoring: heart rate, cardiac monitor, continuous pulse ox and blood pressure Approach: midline Location: L3-4 Injection technique: single-shot Needle Needle type: Sprotte  Needle gauge: 24 G Needle length: 9 cm Assessment Sensory level: T4 Events: CSF return and second provider Additional Notes First attempt by CRNA

## 2023-04-23 ENCOUNTER — Encounter (HOSPITAL_COMMUNITY): Payer: Self-pay | Admitting: Orthopedic Surgery

## 2023-04-23 DIAGNOSIS — M1712 Unilateral primary osteoarthritis, left knee: Secondary | ICD-10-CM | POA: Diagnosis not present

## 2023-04-23 LAB — CBC
HCT: 27.8 % — ABNORMAL LOW (ref 39.0–52.0)
Hemoglobin: 9.3 g/dL — ABNORMAL LOW (ref 13.0–17.0)
MCH: 28.6 pg (ref 26.0–34.0)
MCHC: 33.5 g/dL (ref 30.0–36.0)
MCV: 85.5 fL (ref 80.0–100.0)
Platelets: 151 10*3/uL (ref 150–400)
RBC: 3.25 MIL/uL — ABNORMAL LOW (ref 4.22–5.81)
RDW: 13.8 % (ref 11.5–15.5)
WBC: 9.3 10*3/uL (ref 4.0–10.5)
nRBC: 0 % (ref 0.0–0.2)

## 2023-04-23 LAB — BASIC METABOLIC PANEL
Anion gap: 7 (ref 5–15)
BUN: 20 mg/dL (ref 8–23)
CO2: 23 mmol/L (ref 22–32)
Calcium: 8.4 mg/dL — ABNORMAL LOW (ref 8.9–10.3)
Chloride: 104 mmol/L (ref 98–111)
Creatinine, Ser: 1.04 mg/dL (ref 0.61–1.24)
GFR, Estimated: >60 mL/min (ref >60)
Glucose, Bld: 161 mg/dL — ABNORMAL HIGH (ref 70–99)
Potassium: 4.4 mmol/L (ref 3.5–5.1)
Sodium: 134 mmol/L — ABNORMAL LOW (ref 135–145)

## 2023-04-23 LAB — GLUCOSE, CAPILLARY
Glucose-Capillary: 145 mg/dL — ABNORMAL HIGH (ref 70–99)
Glucose-Capillary: 187 mg/dL — ABNORMAL HIGH (ref 70–99)

## 2023-04-23 MED ORDER — TIZANIDINE HCL 4 MG PO TABS
2.0000 mg | ORAL_TABLET | Freq: Three times a day (TID) | ORAL | Status: DC | PRN
  Administered 2023-04-23: 4 mg via ORAL
  Filled 2023-04-23: qty 1

## 2023-04-23 MED ORDER — OXYCODONE HCL 5 MG PO TABS
10.0000 mg | ORAL_TABLET | ORAL | Status: DC | PRN
  Administered 2023-04-23 (×2): 10 mg via ORAL
  Filled 2023-04-23 (×2): qty 2

## 2023-04-23 MED ORDER — OXYCODONE HCL 5 MG PO TABS: 5.0000 mg | ORAL_TABLET | ORAL | 0 refills | Status: AC | PRN

## 2023-04-23 MED ORDER — TIZANIDINE HCL 2 MG PO TABS: 2.0000 mg | ORAL_TABLET | Freq: Three times a day (TID) | ORAL | 0 refills | Status: AC | PRN

## 2023-04-23 MED ORDER — SENNA 8.6 MG PO TABS: 2.0000 | ORAL_TABLET | Freq: Every day | ORAL | 0 refills | Status: AC

## 2023-04-23 MED ORDER — POLYETHYLENE GLYCOL 3350 17 G PO PACK: 17.0000 g | PACK | Freq: Two times a day (BID) | ORAL | 0 refills | Status: AC

## 2023-04-23 NOTE — Progress Notes (Signed)
Patient discharged to home w/ wife. Given all belongings, instructions. Verbalized understanding of instructions. Escorted to pov via wc. 

## 2023-04-23 NOTE — Progress Notes (Addendum)
   Subjective: 1 Day Post-Op Procedure(s) (LRB): TOTAL KNEE ARTHROPLASTY (Left) Patient reports pain as moderate.   Patient seen in rounds with Dr. Charlann Boxer. Patient is well, and has had no acute complaints or problems. No acute events overnight. Foley catheter removed. Patient ambulated a few feet with PT.  We will start therapy today.   Objective: Vital signs in last 24 hours: Temp:  [97.8 F (36.6 C)-98.1 F (36.7 C)] 98.1 F (36.7 C) (05/29 0658) Pulse Rate:  [54-69] 60 (05/29 0658) Resp:  [10-17] 17 (05/29 0658) BP: (93-148)/(67-99) 146/90 (05/29 0658) SpO2:  [96 %-100 %] 97 % (05/29 0658) Weight:  [98.1 kg] 98.1 kg (05/28 1144)  Intake/Output from previous day:  Intake/Output Summary (Last 24 hours) at 04/23/2023 0731 Last data filed at 04/23/2023 4098 Gross per 24 hour  Intake 4692.8 ml  Output 1990 ml  Net 2702.8 ml     Intake/Output this shift: No intake/output data recorded.  Labs: Recent Labs    04/23/23 0325  HGB 9.3*   Recent Labs    04/23/23 0325  WBC 9.3  RBC 3.25*  HCT 27.8*  PLT 151   Recent Labs    04/23/23 0325  NA 134*  K 4.4  CL 104  CO2 23  BUN 20  CREATININE 1.04  GLUCOSE 161*  CALCIUM 8.4*   No results for input(s): "LABPT", "INR" in the last 72 hours.  Exam: General - Patient is Alert and Oriented Extremity - Neurologically intact Sensation intact distally Intact pulses distally Dorsiflexion/Plantar flexion intact Dressing - dressing C/D/I Motor Function - intact, moving foot and toes well on exam.   Past Medical History:  Diagnosis Date   Arthritis    Atrial fibrillation (HCC)    Diabetes mellitus without complication (HCC)    Gout    Hypertension    Hypothyroidism    MI, old    Thyroid disease    hypothyroid    Assessment/Plan: 1 Day Post-Op Procedure(s) (LRB): TOTAL KNEE ARTHROPLASTY (Left) Principal Problem:   S/P total knee arthroplasty, left  Estimated body mass index is 31.93 kg/m as calculated from  the following:   Height as of this encounter: 5\' 9"  (1.753 m).   Weight as of this encounter: 98.1 kg. Advance diet Up with therapy D/C IV fluids   Patient's anticipated LOS is less than 2 midnights, meeting these requirements: - Younger than 77 - Lives within 1 hour of care - Has a competent adult at home to recover with post-op recover - NO history of  - Chronic pain requiring opiods  - Diabetes  - Coronary Artery Disease  - Heart failure  - Heart attack  - Stroke  - DVT/VTE  - Cardiac arrhythmia  - Respiratory Failure/COPD  - Renal failure  - Anemia  - Advanced Liver disease     DVT Prophylaxis -  Eliquis Weight bearing as tolerated.  Hgb stable at 9.3 this AM.   Plan is to go Home after hospital stay. Plan for discharge today following 1-2 sessions of PT as long as they are meeting their goals. Patient is scheduled for OPPT. Follow up in the office in 2 weeks.   Dennie Bible, PA-C Orthopedic Surgery [phone] 04/23/2023, 7:31 AM

## 2023-04-23 NOTE — Progress Notes (Signed)
Physical Therapy Treatment Patient Details Name: Ronald Whitaker MRN: [id number] DOB: [birth year] Today's Date: 04/23/2023   History of Present Illness Pt is 62 yo male admitted on 04/22/23 for L TKA. Pt with hx including but not limited to arthritis, afib, DM, gout, HTN, MI, Back Surgery, CABG    PT Comments    POD # 1 pm session Spouse present during session for [facility] Education.  Assisted with amb pt in hallway, practiced stairs then reviewed HEP. Addressed all mobility questions, discussed appropriate activity, educated on use of ICE.  Pt ready for D/C to home.   Recommendations for follow up therapy are one component of a multi-disciplinary discharge planning process, led by the attending physician.  Recommendations may be updated based on patient status, additional functional criteria and insurance authorization.  Follow Up Recommendations       Assistance Recommended at Discharge Intermittent Supervision/Assistance  Patient can return home with the following A little help with walking and/or transfers;A little help with bathing/dressing/bathroom;Assistance with cooking/housework;Help with stairs or ramp for entrance   Equipment Recommendations  None recommended by PT    Recommendations for Other Services       Precautions / Restrictions Precautions Precautions: Fall;Knee Precaution Comments: no pillow under knee Restrictions Weight Bearing Restrictions: No LLE Weight Bearing: Weight bearing as tolerated     Mobility  Bed Mobility               General bed mobility comments: OOB in recliner    Transfers Overall transfer level: Needs assistance Equipment used: Rolling walker (2 wheels) Transfers: Sit to/from Stand, Bed to chair/wheelchair/BSC Sit to Stand: Supervision           General transfer comment: VC's on proper hand placement as well as safety with turns.    Ambulation/Gait Ambulation/Gait assistance: Supervision, Min guard Gait Distance  (Feet): 75 Feet Assistive device: Rolling walker (2 wheels) Gait Pattern/deviations: Step-to pattern, Decreased stance time - left Gait velocity: decreased     General Gait Details: VC's on proper walker to self distance as well as safety with turns.   Stairs Stairs: Yes Stairs assistance: Supervision, Min guard Stair Management: Two rails, Step to pattern, Forwards Number of Stairs: 3 General stair comments: with Spouse "hands on"   Wheelchair Mobility    Modified Rankin (Stroke Patients Only)       Balance                                            Cognition Arousal/Alertness: Awake/alert Behavior During Therapy: WFL for tasks assessed/performed Overall Cognitive Status: Within Functional Limits for tasks assessed                                 General Comments: AxO x 3 pleasant/motivated        Exercises      General Comments        Pertinent Vitals/Pain Pain Assessment Pain Assessment: 0-10 Pain Score: 4  Pain Location: L knee Pain Descriptors / Indicators: Discomfort, Sore, Operative site guarding Pain Intervention(s): Monitored during session, Premedicated before session, Repositioned, Ice applied    Home Living                          Prior Function  PT Goals (current goals can now be found in the care plan section) Progress towards PT goals: Progressing toward goals    Frequency    7X/week      PT Plan Current plan remains appropriate    Co-evaluation              AM-PAC PT "6 Clicks" Mobility   Outcome Measure  Help needed turning from your back to your side while in a flat bed without using bedrails?: A Little Help needed moving from lying on your back to sitting on the side of a flat bed without using bedrails?: A Little Help needed moving to and from a bed to a chair (including a wheelchair)?: A Little Help needed standing up from a chair using your arms (e.g.,  wheelchair or bedside chair)?: A Little Help needed to walk in hospital room?: A Little Help needed climbing 3-5 steps with a railing? : A Little 6 Click Score: 18    End of Session Equipment Utilized During Treatment: Gait belt Activity Tolerance: Patient tolerated treatment well Patient left: with chair alarm set;in chair;with call bell/phone within reach Nurse Communication: Mobility status PT Visit Diagnosis: Other abnormalities of gait and mobility (R26.89);Muscle weakness (generalized) (M62.81)     Time: 1410-1435 PT Time Calculation (min) (ACUTE ONLY): 25 min  Charges:  $Gait Training: 8-22 mins $Therapeutic Exercise: 8-22 mins                     {Beck Cofer  PTA Acute  [employer] M-F          [phone]

## 2023-04-23 NOTE — Progress Notes (Signed)
Physical Therapy Treatment Patient Details Name: Ronald Whitaker MRN: [id number] DOB: 08-Mar-1961 Today's Date: 04/23/2023   History of Present Illness Pt is 62 yo male admitted on 04/22/23 for L TKA. Pt with hx including but not limited to arthritis, afib, DM, gout, HTN, MI, Back Surgery, CABG    PT Comments    POD # 1 am session Assisted OOB to amb in hallway.  Then returned to room to perform some TE's following HEP handout.  Instructed on proper tech, freq as well as use of ICE.   Pt will need another session with Spouse prior to D/C.   Recommendations for follow up therapy are one component of a multi-disciplinary discharge planning process, led by the attending physician.  Recommendations may be updated based on patient status, additional functional criteria and insurance authorization.  Follow Up Recommendations       Assistance Recommended at Discharge Intermittent Supervision/Assistance  Patient can return home with the following A little help with walking and/or transfers;A little help with bathing/dressing/bathroom;Assistance with cooking/housework;Help with stairs or ramp for entrance   Equipment Recommendations  None recommended by PT    Recommendations for Other Services       Precautions / Restrictions Precautions Precautions: Fall;Knee Precaution Comments: no pillow under knee Restrictions Weight Bearing Restrictions: No LLE Weight Bearing: Weight bearing as tolerated     Mobility  Bed Mobility               General bed mobility comments: OOB in recliner    Transfers Overall transfer level: Needs assistance Equipment used: Rolling walker (2 wheels) Transfers: Sit to/from Stand, Bed to chair/wheelchair/BSC Sit to Stand: Supervision           General transfer comment: VC's on proper hand placement as well as safety with turns.    Ambulation/Gait Ambulation/Gait assistance: Supervision, Min guard Gait Distance (Feet): 45 Feet Assistive  device: Rolling walker (2 wheels) Gait Pattern/deviations: Step-to pattern, Decreased stance time - left Gait velocity: decreased     General Gait Details: VC's on proper walker to self distance as well as safety with turns.   Stairs             Wheelchair Mobility    Modified Rankin (Stroke Patients Only)       Balance                                            Cognition Arousal/Alertness: Awake/alert Behavior During Therapy: WFL for tasks assessed/performed Overall Cognitive Status: Within Functional Limits for tasks assessed                                 General Comments: AxO x 3 pleasant/motivated        Exercises  Total Knee Replacement TE's following HEP handout 10 reps B LE ankle pumps 05 reps towel squeezes 05 reps knee presses 05 reps heel slides  05 reps SAQ's 05 reps SLR's 05 reps ABD Educated on use of gait belt to assist with TE's Followed by ICE     General Comments        Pertinent Vitals/Pain Pain Assessment Pain Assessment: 0-10 Pain Score: 4  Pain Location: L knee Pain Descriptors / Indicators: Discomfort, Sore, Operative site guarding Pain Intervention(s): Monitored during session, Premedicated before session, Repositioned, Ice applied  Home Living                          Prior Function            PT Goals (current goals can now be found in the care plan section) Progress towards PT goals: Progressing toward goals    Frequency    7X/week      PT Plan Current plan remains appropriate    Co-evaluation              AM-PAC PT "6 Clicks" Mobility   Outcome Measure  Help needed turning from your back to your side while in a flat bed without using bedrails?: A Little Help needed moving from lying on your back to sitting on the side of a flat bed without using bedrails?: A Little Help needed moving to and from a bed to a chair (including a wheelchair)?: A  Little Help needed standing up from a chair using your arms (e.g., wheelchair or bedside chair)?: A Little Help needed to walk in hospital room?: A Little Help needed climbing 3-5 steps with a railing? : A Little 6 Click Score: 18    End of Session Equipment Utilized During Treatment: Gait belt Activity Tolerance: Patient tolerated treatment well Patient left: with chair alarm set;in chair;with call bell/phone within reach Nurse Communication: Mobility status PT Visit Diagnosis: Other abnormalities of gait and mobility (R26.89);Muscle weakness (generalized) (M62.81)     Time: 1610-9604 PT Time Calculation (min) (ACUTE ONLY): 29 min  Charges:  $Gait Training: 8-22 mins $Therapeutic Exercise: 8-22 mins                     Felecia Shelling  PTA Acute  Rehabilitation Services Office M-F          [phone]

## 2023-04-23 NOTE — TOC Transition Note (Signed)
Transition of Care [facility]) - CM/SW Discharge Note   Patient Details  Name: Ronald Whitaker MRN: [id number] Date of Birth: 02-26-1961  Transition of Care [facility]) CM/SW Contact:  Amada Jupiter, LCSW Phone Number: 04/23/2023, 9:58 AM   Clinical Narrative:    Met with pt and confirming he has needed DME at home.  OPPT already arranged at [facility] ([place]).  No TOC needs.   Final next level of care: OP Rehab Barriers to Discharge: No Barriers Identified   Patient Goals and CMS Choice      Discharge Placement                         Discharge Plan and Services Additional resources added to the After Visit Summary for                  DME Arranged: N/A DME Agency: NA                  Social Determinants of Health (SDOH) Interventions SDOH Screenings   Food Insecurity: No Food Insecurity (04/22/2023)  Housing: Low Risk  (04/22/2023)  Transportation Needs: No Transportation Needs (04/22/2023)  Utilities: Not At Risk (04/22/2023)  Tobacco Use: Low Risk  (04/22/2023)     Readmission Risk Interventions     No data to display

## 2023-04-24 ENCOUNTER — Ambulatory Visit: Payer: Medicare HMO | Attending: Orthopedic Surgery

## 2023-04-24 ENCOUNTER — Encounter: Payer: Self-pay | Admitting: Physical Therapy

## 2023-04-24 DIAGNOSIS — M25662 Stiffness of left knee, not elsewhere classified: Secondary | ICD-10-CM | POA: Insufficient documentation

## 2023-04-24 DIAGNOSIS — R262 Difficulty in walking, not elsewhere classified: Secondary | ICD-10-CM | POA: Insufficient documentation

## 2023-04-24 DIAGNOSIS — R269 Unspecified abnormalities of gait and mobility: Secondary | ICD-10-CM | POA: Insufficient documentation

## 2023-04-24 DIAGNOSIS — M6281 Muscle weakness (generalized): Secondary | ICD-10-CM | POA: Diagnosis present

## 2023-04-24 NOTE — Therapy (Signed)
OUTPATIENT PHYSICAL THERAPY LOWER EXTREMITY EVALUATION   Patient Name: Ronald Whitaker MRN: [id number] DOB:[birth year], 62 y.o., male Today's Date: 04/24/2023  END OF SESSION:  PT End of Session - 04/24/23 1324     Visit Number 1    Number of Visits 17    Date for PT Re-Evaluation 06/19/23    PT Start Time 1355    PT Stop Time 1440    PT Time Calculation (min) 45 min    Equipment Utilized During Treatment Gait belt    Activity Tolerance Patient tolerated treatment well    Behavior During Therapy WFL for tasks assessed/performed             Past Medical History:  Diagnosis Date   Arthritis    Atrial fibrillation (HCC)    Diabetes mellitus without complication (HCC)    Gout    Hypertension    Hypothyroidism    MI, old    Thyroid disease    hypothyroid   Past Surgical History:  Procedure Laterality Date   BACK SURGERY     CARDIAC CATHETERIZATION     CATARACT EXTRACTION W/ INTRAOCULAR LENS IMPLANT Bilateral    CORONARY ARTERY BYPASS GRAFT     2014   TOTAL KNEE ARTHROPLASTY Left 04/22/2023   Procedure: TOTAL KNEE ARTHROPLASTY;  Surgeon: Durene Romans, MD;  Location: [facility] ORS;  Service: Orthopedics;  Laterality: Left;   Patient Active Problem List   Diagnosis Date Noted   S/P total knee arthroplasty, left 04/22/2023    PCP: Furin, Caryl Asp, FNP  REFERRING PROVIDER: Durene Romans, MD  REFERRING DIAG:  Diagnosis  [phone] (ICD-10-CM) - S/P total knee arthroplasty, left    THERAPY DIAG:  Muscle weakness (generalized)  Abnormality of gait and mobility  Difficulty in walking, not elsewhere classified  Stiffness of left knee, not elsewhere classified  Rationale for Evaluation and Treatment: Rehabilitation  ONSET DATE: 04/22/23  SUBJECTIVE:   SUBJECTIVE STATEMENT: Pt is a pleasant 62 y.o. s/p L TKA on 04/22/23.  PERTINENT HISTORY: Pt is a pleasant 62 y.o. s/p L TKA on 04/22/23. Pt denies falls since being discharged from hospital on 04/23/23 and pain is well  controlled. Using RW correctly and consistently reporting spouse is helping him as needed. Excited for rehab process and return to PLOF.  PAIN:  Are you having pain? Yes: NPRS scale: 6/10 Pain location: Anterior knee and quad Pain description: Sharp Aggravating factors: rest Relieving factors: mobility  PRECAUTIONS: Knee  WEIGHT BEARING RESTRICTIONS: Yes WBAT  FALLS:  Has patient fallen in last 6 months? No  LIVING ENVIRONMENT: Lives with: lives with their spouse Lives in: House/apartment Stairs: Yes: External: 3-4 steps; on right going up, on left going up, and can reach both Has following equipment at home: Single point cane, Walker - 2 wheeled, bed side commode, and Grab bars  OCCUPATION: Works part time  PLOF: Independent  PATIENT GOALS: Return to PLOF.   NEXT MD VISIT: Early/mid june  OBJECTIVE:   DIAGNOSTIC FINDINGS: N/A  PATIENT SURVEYS:  FOTO 36 with target score of 61  COGNITION: Overall cognitive status: Within functional limits for tasks assessed     SENSATION: WFL  EDEMA:  Circumferential: R: 16", L: 17.25"   POSTURE: No Significant postural limitations  PALPATION: Generally around L artificial knee joint.   LOWER EXTREMITY ROM:  Active ROM Right eval Left eval  Hip flexion > 90  > 90  Hip extension    Hip abduction WNL WNL  Hip adduction  Hip internal rotation    Hip external rotation    Knee flexion  65 AROM/ 80 PROM  Knee extension 0 6  Ankle dorsiflexion WNL WNL  Ankle plantarflexion    Ankle inversion    Ankle eversion     (Blank rows = not tested)  LOWER EXTREMITY MMT:  MMT Right eval Left eval  Hip flexion 5 2  Hip extension    Hip abduction 5 4  Hip adduction 5 4  Hip internal rotation    Hip external rotation    Knee flexion 5 4  Knee extension 5 2  Ankle dorsiflexion 5 5  Ankle plantarflexion 5 5  Ankle inversion    Ankle eversion     (Blank rows = not tested)   FUNCTIONAL TESTS:  5 times sit to stand:  35.38 seconds 10 meter walk test: 0.32 m/s using RW  GAIT: Distance walked: 10 meters Assistive device utilized: Walker - 2 wheeled Level of assistance: Modified independence Comments: Consistent step through gait and L heel strike at initial contact. Relies on heavy BUE support on RW but good weight acceptance on LLE throughout stance phase.    TODAY'S TREATMENT:                                                                                                                              DATE: 04/24/23   Reviewed POC, hospital issued HEP. Prognosis in general and early AROM expectations.   PATIENT EDUCATION:  Education details: Reviewed POC, hospital issued HEP. Prognosis in general and early AROM expectations. Person educated: Patient and Spouse Education method: Explanation, Demonstration, Tactile cues, and Verbal cues Education comprehension: verbalized understanding and needs further education  HOME EXERCISE PROGRAM: Defer to next session. Educated pt and spouse to maintain hospital issued Hep with heavy focus on quad sets and heel slides for knee AROM return. Encouraged pt and spouse to bring in HEP at f/u session to review with PT to develop appropriate HEP.  ASSESSMENT:  CLINICAL IMPRESSION: Patient is a 62 y.o. male who was seen today for physical therapy evaluation and treatment for L TKA. Pt presents with deficits in LLE strength with significant limitations in quad activation with quad lag via SLR, limitations in L knee AROM, acute edema, acute L knee pain, and reduced gait cadence lending pt to require RW to reduce falls risk. These deficits are limiting pt in participation in work related tasks, community walking distance, and general ADL/IADL completion. Pt will benefit from skilled PT services to address acute deficits to return to PLOF and meet all PT goals.   OBJECTIVE IMPAIRMENTS: Abnormal gait, decreased activity tolerance, decreased balance, decreased endurance,  decreased knowledge of condition, decreased knowledge of use of DME, decreased mobility, difficulty walking, decreased ROM, decreased strength, decreased safety awareness, hypomobility, increased edema, impaired flexibility, and pain.   ACTIVITY LIMITATIONS: carrying, lifting, standing, squatting, and locomotion level  PARTICIPATION LIMITATIONS: laundry, driving, shopping, community activity, occupation, and  yard work  PERSONAL FACTORS: Age, Education, Fitness, Past/current experiences, Profession, Time since onset of injury/illness/exacerbation, and 3+ comorbidities: Hx of HTN, A-fib, CABG, DM , hypothyroidism   are also affecting patient's functional outcome.   REHAB POTENTIAL: Good  CLINICAL DECISION MAKING: Stable/uncomplicated  EVALUATION COMPLEXITY: Low   GOALS: Goals reviewed with patient? No  SHORT TERM GOALS: Target date: 06/21/23 Pt will be independent with HEP to improve L knee AROM and strength Baseline: 04/24/23: reviewed hospital based HEP Goal status: INITIAL  2. Pt will demo ability to complete LLE SLR without absence of quad lag.   Baseline: 04/24/23: significant quad lag  Goal status: INITIAL  LONG TERM GOALS: Target date: 06/19/23  Pt will improve FOTO to target score to demonstrate clinically significant improvement in functional mobility. Baseline: 36 with target score of 61 Goal status: INITIAL  2.  Pt will improve L knee AROM from 0- >/= 120 to demonstrate full return to knee mobility for functional mobility tasks Baseline: 04/24/23: 6-65 AROM Goal status: INITIAL  3.  Pt will improve gait speed to >/= 1.0 m/s with LRAD to demonstrate reduced falls risk with short community distances.  Baseline: 04/24/23: Relies on RW completing .32 m/s Goal status: INITIAL  4.  Pt will improve 5xSTS to 12 seconds or less to demonstrate clinically significant improvement in LE strength  Baseline: 04/24/23: 35.38 seconds with reduced WB'ing on LLE. Goal status:  INITIAL   PLAN:  PT FREQUENCY: 2x/week  PT DURATION: 8 weeks  PLANNED INTERVENTIONS: Therapeutic exercises, Therapeutic activity, Neuromuscular re-education, Balance training, Gait training, Patient/Family education, Self Care, Joint mobilization, DME instructions, Dry Needling, Electrical stimulation, Cryotherapy, Moist heat, scar mobilization, Manual therapy, and Re-evaluation  PLAN FOR NEXT SESSION: Review HEP, L knee AROM, advance gait with LRAD.   Delphia Grates. Fairly IV, PT, DPT Physical Therapist- [place]  [facility]  04/24/2023, 3:03 PM

## 2023-04-28 NOTE — Discharge Summary (Signed)
Patient ID: Ronald Whitaker MRN: [id number] DOB/AGE: 1961/03/20 62 y.o.  Admit date: 04/22/2023 Discharge date: 04/23/2023  Admission Diagnoses:  Left knee osteoarthritis  Discharge Diagnoses:  Principal Problem:   S/P total knee arthroplasty, left   Past Medical History:  Diagnosis Date   Arthritis    Atrial fibrillation (HCC)    Diabetes mellitus without complication (HCC)    Gout    Hypertension    Hypothyroidism    MI, old    Thyroid disease    hypothyroid    Surgeries: Procedure(s): TOTAL KNEE ARTHROPLASTY on 04/22/2023   Consultants:   Discharged Condition: Improved  Hospital Course: Ronald Whitaker is an 62 y.o. male who was admitted 04/22/2023 for operative treatment ofS/P total knee arthroplasty, left. Patient has severe unremitting pain that affects sleep, daily activities, and work/hobbies. After pre-op clearance the patient was taken to the operating room on 04/22/2023 and underwent  Procedure(s): TOTAL KNEE ARTHROPLASTY.    Patient was given perioperative antibiotics:  Anti-infectives (From admission, onward)    Start     Dose/Rate Route Frequency Ordered Stop   04/22/23 1330  ceFAZolin (ANCEF) IVPB 2g/100 mL premix        2 g 200 mL/hr over 30 Minutes Intravenous Every 6 hours 04/22/23 1040 04/22/23 2011   04/22/23 0600  ceFAZolin (ANCEF) IVPB 2g/100 mL premix        2 g 200 mL/hr over 30 Minutes Intravenous On call to O.R. 04/22/23 [id number] 04/22/23 0717        Patient was given sequential compression devices, early ambulation, and chemoprophylaxis to prevent DVT. Patient worked with PT and was meeting their goals regarding safe ambulation and transfers.  Patient benefited maximally from hospital stay and there were no complications.    Recent vital signs: No data found.   Recent laboratory studies: No results for input(s): "WBC", "HGB", "HCT", "PLT", "NA", "K", "CL", "CO2", "BUN", "CREATININE", "GLUCOSE", "INR", "CALCIUM" in the last 72  hours.  Invalid input(s): "PT", "2"   Discharge Medications:   Allergies as of 04/23/2023       Reactions   Beef Allergy Anaphylaxis, Hives   Pork Allergy Anaphylaxis   Pt has alphagal, rxn to pork and red meat, delayed anaphylaxis   Colchicine Nausea And Vomiting        Medication List     TAKE these medications    acetaminophen 500 MG tablet Commonly known as: TYLENOL Take 1,000 mg by mouth every 6 (six) hours as needed for mild pain or headache.   allopurinol 100 MG tablet Commonly known as: ZYLOPRIM Take 100 mg by mouth daily.   amLODipine 10 MG tablet Commonly known as: NORVASC Take 10 mg by mouth at bedtime.   atorvastatin 40 MG tablet Commonly known as: LIPITOR Take 40 mg by mouth at bedtime.   benazepril 20 MG tablet Commonly known as: LOTENSIN Take 30 mg by mouth daily.   dicyclomine 10 MG capsule Commonly known as: BENTYL Take 10 mg by mouth 4 (four) times daily as needed (for abdominal pain).   dofetilide 500 MCG capsule Commonly known as: TIKOSYN Take 500 mcg by mouth 2 (two) times daily.   dorzolamide-timolol 2-0.5 % ophthalmic solution Commonly known as: COSOPT Place 1 drop into both eyes 2 (two) times daily.   Eliquis 5 MG Tabs tablet Generic drug: apixaban Take 5 mg by mouth 2 (two) times daily.   latanoprost 0.005 % ophthalmic solution Commonly known as: XALATAN Place 1 drop into both eyes at  bedtime.   levothyroxine 137 MCG tablet Commonly known as: SYNTHROID Take 137 mcg by mouth daily before breakfast.   metFORMIN 500 MG tablet Commonly known as: GLUCOPHAGE Take 1,000 mg by mouth 2 (two) times daily with a meal.   metoprolol tartrate 25 MG tablet Commonly known as: LOPRESSOR Take 12.5 mg by mouth 2 (two) times daily.   multivitamin with minerals Tabs tablet Take 1 tablet by mouth daily.   oxyCODONE 5 MG immediate release tablet Commonly known as: Oxy IR/ROXICODONE Take 1-2 tablets (5-10 mg total) by mouth every 4 (four)  hours as needed for severe pain. Start with 1 tablet every 4 hours as needed, use 2 only for severe pain.   pantoprazole 40 MG tablet Commonly known as: PROTONIX Take 40 mg by mouth daily.   polyethylene glycol 17 g packet Commonly known as: MIRALAX / GLYCOLAX Take 17 g by mouth 2 (two) times daily.   senna 8.6 MG Tabs tablet Commonly known as: SENOKOT Take 2 tablets (17.2 mg total) by mouth at bedtime for 14 days.   tiZANidine 2 MG tablet Commonly known as: ZANAFLEX Take 1-2 tablets (2-4 mg total) by mouth every 8 (eight) hours as needed for muscle spasms.   Evaristo Bury FlexTouch 100 UNIT/ML FlexTouch Pen Generic drug: insulin degludec Inject 32 Units into the skin every evening.   valACYclovir 500 MG tablet Commonly known as: VALTREX Take 500-1,000 mg by mouth daily as needed (Flare-up).               Discharge Care Instructions  (From admission, onward)           Start     Ordered   04/23/23 0000  Change dressing       Comments: Maintain surgical dressing until follow up in the clinic. If the edges start to pull up, may reinforce with tape. If the dressing is no longer working, may remove and cover with gauze and tape, but must keep the area dry and clean.  Call with any questions or concerns.   04/23/23 0735            Diagnostic Studies: No results found.  Disposition: Discharge disposition: 01-Home or Self Care       Discharge Instructions     Call MD / Call 911   Complete by: As directed    If you experience chest pain or shortness of breath, CALL 911 and be transported to the hospital emergency room.  If you develope a fever above 101 F, pus (white drainage) or increased drainage or redness at the wound, or calf pain, call your surgeon's office.   Change dressing   Complete by: As directed    Maintain surgical dressing until follow up in the clinic. If the edges start to pull up, may reinforce with tape. If the dressing is no longer working, may  remove and cover with gauze and tape, but must keep the area dry and clean.  Call with any questions or concerns.   Constipation Prevention   Complete by: As directed    Drink plenty of fluids.  Prune juice may be helpful.  You may use a stool softener, such as Colace (over the counter) 100 mg twice a day.  Use MiraLax (over the counter) for constipation as needed.   Diet - low sodium heart healthy   Complete by: As directed    Increase activity slowly as tolerated   Complete by: As directed    Weight bearing as tolerated with  assist device (walker, cane, etc) as directed, use it as long as suggested by your surgeon or therapist, typically at least 4-6 weeks.   Post-operative opioid taper instructions:   Complete by: As directed    POST-OPERATIVE OPIOID TAPER INSTRUCTIONS: It is important to wean off of your opioid medication as soon as possible. If you do not need pain medication after your surgery it is ok to stop day one. Opioids include: Codeine, Hydrocodone(Norco, Vicodin), Oxycodone(Percocet, oxycontin) and hydromorphone amongst others.  Long term and even short term use of opiods can cause: Increased pain response Dependence Constipation Depression Respiratory depression And more.  Withdrawal symptoms can include Flu like symptoms Nausea, vomiting And more Techniques to manage these symptoms Hydrate well Eat regular healthy meals Stay active Use relaxation techniques(deep breathing, meditating, yoga) Do Not substitute Alcohol to help with tapering If you have been on opioids for less than two weeks and do not have pain than it is ok to stop all together.  Plan to wean off of opioids This plan should start within one week post op of your joint replacement. Maintain the same interval or time between taking each dose and first decrease the dose.  Cut the total daily intake of opioids by one tablet each day Next start to increase the time between doses. The last dose that  should be eliminated is the evening dose.      TED hose   Complete by: As directed    Use stockings (TED hose) for 2 weeks on both leg(s).  You may remove them at night for sleeping.        Follow-up Information     Durene Romans, MD. Schedule an appointment as soon as possible for a visit in 2 week(s).   Specialty: Orthopedic Surgery Contact information: [address] [place] [address] [place] [address] [phone]                  Signed: Cassandria Anger 04/28/2023, 7:28 AM

## 2023-04-29 ENCOUNTER — Ambulatory Visit: Payer: Medicare HMO | Attending: Orthopedic Surgery | Admitting: Physical Therapy

## 2023-04-29 DIAGNOSIS — R269 Unspecified abnormalities of gait and mobility: Secondary | ICD-10-CM | POA: Diagnosis present

## 2023-04-29 DIAGNOSIS — M6281 Muscle weakness (generalized): Secondary | ICD-10-CM | POA: Diagnosis present

## 2023-04-29 DIAGNOSIS — M25662 Stiffness of left knee, not elsewhere classified: Secondary | ICD-10-CM | POA: Insufficient documentation

## 2023-04-29 DIAGNOSIS — R262 Difficulty in walking, not elsewhere classified: Secondary | ICD-10-CM | POA: Insufficient documentation

## 2023-04-29 NOTE — Therapy (Unsigned)
OUTPATIENT PHYSICAL THERAPY TREATMENT NOTE   Patient Name: Ronald Whitaker MRN: [id number] DOB:[birth year], 62 y.o., male Today's Date: 04/29/2023  END OF SESSION:   PT End of Session - 04/29/23 1257     Visit Number 2    Number of Visits 17    Date for PT Re-Evaluation 06/19/23    PT Start Time 1258    PT Stop Time 1341    PT Time Calculation (min) 43 min    Equipment Utilized During Treatment Gait belt    Activity Tolerance Patient tolerated treatment well    Behavior During Therapy WFL for tasks assessed/performed             Past Medical History:  Diagnosis Date   Arthritis    Atrial fibrillation (HCC)    Diabetes mellitus without complication (HCC)    Gout    Hypertension    Hypothyroidism    MI, old    Thyroid disease    hypothyroid   Past Surgical History:  Procedure Laterality Date   BACK SURGERY     CARDIAC CATHETERIZATION     CATARACT EXTRACTION W/ INTRAOCULAR LENS IMPLANT Bilateral    CORONARY ARTERY BYPASS GRAFT     2014   TOTAL KNEE ARTHROPLASTY Left 04/22/2023   Procedure: TOTAL KNEE ARTHROPLASTY;  Surgeon: Durene Romans, MD;  Location: [facility];  Service: Orthopedics;  Laterality: Left;   Patient Active Problem List   Diagnosis Date Noted   S/P total knee arthroplasty, left 04/22/2023    PCP: Furin, Caryl Asp, FNP   REFERRING PROVIDER: Durene Romans, MD  REFERRING DIAG:  [phone] (ICD-10-CM) - S/P total knee arthroplasty, left    THERAPY DIAG:  Muscle weakness (generalized)  Abnormality of gait and mobility  Difficulty in walking, not elsewhere classified  Stiffness of left knee, not elsewhere classified  Rationale for Evaluation and Treatment Rehabilitation  PERTINENT HISTORY: Pt is a pleasant 62 y.o. s/p L TKA on 04/22/23. Pt denies falls since being discharged from hospital on 04/23/23 and pain is well controlled. Using RW correctly and consistently reporting spouse is helping him as needed. Excited for rehab process and return to PLOF.    PAIN:  Are you having pain? Yes: NPRS scale: 6/10 Pain location: Anterior knee and quad Pain description: Sharp Aggravating factors: rest Relieving factors: mobility   PRECAUTIONS: Knee   WEIGHT BEARING RESTRICTIONS: Yes WBAT   FALLS:  Has patient fallen in last 6 months? No   LIVING ENVIRONMENT: Lives with: lives with their spouse Lives in: House/apartment Stairs: Yes: External: 3-4 steps; on right going up, on left going up, and can reach both Has following equipment at home: Single point cane, Walker - 2 wheeled, bed side commode, and Grab bars   OCCUPATION: Works part time   PLOF: Independent    PRECAUTIONS: S/p L TKA 04/02/23   SUBJECTIVE:  SUBJECTIVE STATEMENT:  Patient reports doing generally well with pain control. He reports having notable jolting pain following showering last night. Patient reports compliance with HEP from hospital.    PAIN:  Are you having pain? Yes: NPRS scale: 6-7/10    OBJECTIVE: (objective measures completed at initial evaluation unless otherwise dated)   DIAGNOSTIC FINDINGS: N/A   PATIENT SURVEYS:  FOTO 36 with target score of 61   COGNITION: Overall cognitive status: Within functional limits for tasks assessed                         SENSATION: WFL   EDEMA:  Circumferential: R: 16", L: 17.25"     POSTURE: No Significant postural limitations   PALPATION: Generally around L artificial knee joint.    LOWER EXTREMITY ROM:   Active ROM Right eval Left eval  Hip flexion > 90  > 90  Hip extension      Hip abduction WNL WNL  Hip adduction      Hip internal rotation      Hip external rotation      Knee flexion   65 AROM/ 80 PROM  Knee extension 0 6  Ankle dorsiflexion WNL WNL  Ankle plantarflexion      Ankle inversion      Ankle  eversion       (Blank rows = not tested)   LOWER EXTREMITY MMT:   MMT Right eval Left eval  Hip flexion 5 2  Hip extension      Hip abduction 5 4  Hip adduction 5 4  Hip internal rotation      Hip external rotation      Knee flexion 5 4  Knee extension 5 2  Ankle dorsiflexion 5 5  Ankle plantarflexion 5 5  Ankle inversion      Ankle eversion       (Blank rows = not tested)     FUNCTIONAL TESTS:  5 times sit to stand: 35.38 seconds 10 meter walk test: 0.32 m/s using RW   GAIT: Distance walked: 10 meters Assistive device utilized: Walker - 2 wheeled Level of assistance: Modified independence Comments: Consistent step through gait and L heel strike at initial contact. Relies on heavy BUE support on RW but good weight acceptance on LLE throughout stance phase.       TODAY'S TREATMENT:                                                                                                                              DATE: 05/01/2023     Manual Therapy - for symptom modulation, soft tissue sensitivity and mobility, joint mobility, ROM   Patellar mobilization; 2 x 30 sec ea dir, gr III for joint mobility  L knee PROM in supine; x 10 bouts with flexion/extension as tolerated Tibiofemoral mobilization posterior for knee flexion; gr II-III; 2 x 30 sec bouts per grade in hooklying and  then in sitting position L knee flexion over edge of bed with 60 second hold   AROM: -3-70 PROM: -3-83   Therapeutic Exercise - for improved soft tissue flexibility and extensibility as needed for ROM, improved strength as needed to improve performance of CKC activities/functional movements  Quad set, x 15, 5 sec hold; folded towel behind knee  Heel slide AAROM; x10, 5 sec hold Short arc quad, blue bolster under knee; therapist assist to initiate motion; 2x5  // bars: Forward stepping with verbal cueing and demo for heel heel strike at initial contact; 4x D/B length of bars Sidestepping; 3x D/B  length of bars  PATIENT EDUCATION: Discussed existing HEP and reviewed knee extension stretch with foot propped on ottoman/foot stool. Discussed prognosis and POC    ASSESSMENT:   CLINICAL IMPRESSION: Patient has notable pain with end-range knee extension more so than flexion. He is able to obtain near-full extension both actively and passively. Knee flexion ROM is improving gradually with current limitations being typical for pt being this acute post-operatively (1 week out from surgery today). Pt demonstrates normalizing gait cadence with his walker. He has significant quad weakness/inhibition remaining. Pt has remaining deficits in LLE strength with significant limitations in quad activation with quad lag via SLR, limitations in L knee A/PROM, acute edema, acute L knee pain, and gait changes. Pt will benefit from skilled PT services to address acute deficits to return to PLOF and meet all PT goals.    OBJECTIVE IMPAIRMENTS: Abnormal gait, decreased activity tolerance, decreased balance, decreased endurance, decreased knowledge of condition, decreased knowledge of use of DME, decreased mobility, difficulty walking, decreased ROM, decreased strength, decreased safety awareness, hypomobility, increased edema, impaired flexibility, and pain.    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    REHAB POTENTIAL: Good   CLINICAL DECISION MAKING: Stable/uncomplicated   EVALUATION COMPLEXITY: Low     GOALS: Goals reviewed with patient? No   SHORT TERM GOALS: Target date: 06/21/23 Pt will be independent with HEP  to improve L knee AROM and strength Baseline: 04/24/23: reviewed hospital based HEP Goal status: INITIAL   2. Pt will demo ability to complete LLE SLR without absence of quad lag.             Baseline: 04/24/23: significant quad lag            Goal status: INITIAL   LONG TERM GOALS: Target date: 06/19/23   Pt will improve FOTO to target score to demonstrate clinically significant improvement in functional mobility. Baseline: 36 with target score of 61 Goal status: INITIAL   2.  Pt will improve L knee AROM from 0- >/= 120 to demonstrate full return to knee mobility for functional mobility tasks Baseline: 04/24/23: 6-65 AROM Goal status: INITIAL   3.  Pt will improve gait speed to >/= 1.0 m/s with LRAD to demonstrate reduced falls risk with short community distances.  Baseline: 04/24/23: Relies on RW completing .32 m/s Goal status: INITIAL   4.  Pt will improve 5xSTS to 12 seconds or less to demonstrate clinically significant improvement in LE strength  Baseline: 04/24/23: 35.38 seconds with reduced WB'ing on LLE. Goal status: INITIAL     PLAN:  PT FREQUENCY: 2x/week   PT DURATION: 8 weeks   PLANNED INTERVENTIONS: Therapeutic exercises, Therapeutic activity, Neuromuscular re-education, Balance training, Gait training, Patient/Family education, Self Care, Joint mobilization, DME instructions, Dry Needling, Electrical stimulation, Cryotherapy, Moist heat, scar mobilization, Manual therapy, and Re-evaluation   PLAN FOR NEXT SESSION: Review HEP packet from hospital and progress HEP as able, L knee ROM, quadriceps activation. Advance gait with LRAD.    Consuela Mimes, PT, DPT #[id number] Gertie Exon, PT 04/29/2023, 12:57 PM

## 2023-05-01 ENCOUNTER — Ambulatory Visit: Payer: Medicare HMO | Admitting: Physical Therapy

## 2023-05-01 ENCOUNTER — Encounter: Payer: Self-pay | Admitting: Physical Therapy

## 2023-05-01 DIAGNOSIS — R269 Unspecified abnormalities of gait and mobility: Secondary | ICD-10-CM

## 2023-05-01 DIAGNOSIS — R262 Difficulty in walking, not elsewhere classified: Secondary | ICD-10-CM

## 2023-05-01 DIAGNOSIS — M6281 Muscle weakness (generalized): Secondary | ICD-10-CM | POA: Diagnosis not present

## 2023-05-01 DIAGNOSIS — M25662 Stiffness of left knee, not elsewhere classified: Secondary | ICD-10-CM

## 2023-05-01 NOTE — Therapy (Signed)
OUTPATIENT PHYSICAL THERAPY TREATMENT NOTE   Patient Name: Ronald Whitaker MRN: [id number] DOB:[birth year], 62 y.o., male Today's Date: 05/01/2023  END OF SESSION:   PT End of Session - 05/01/23 1721     Visit Number 3    Number of Visits 17    Date for PT Re-Evaluation 06/19/23    PT Start Time 1651    PT Stop Time 1730    PT Time Calculation (min) 39 min    Equipment Utilized During Treatment Gait belt    Activity Tolerance Patient tolerated treatment well    Behavior During Therapy WFL for tasks assessed/performed              Past Medical History:  Diagnosis Date   Arthritis    Atrial fibrillation (HCC)    Diabetes mellitus without complication (HCC)    Gout    Hypertension    Hypothyroidism    MI, old    Thyroid disease    hypothyroid   Past Surgical History:  Procedure Laterality Date   BACK SURGERY     CARDIAC CATHETERIZATION     CATARACT EXTRACTION W/ INTRAOCULAR LENS IMPLANT Bilateral    CORONARY ARTERY BYPASS GRAFT     2014   TOTAL KNEE ARTHROPLASTY Left 04/22/2023   Procedure: TOTAL KNEE ARTHROPLASTY;  Surgeon: Durene Romans, MD;  Location: [facility];  Service: Orthopedics;  Laterality: Left;   Patient Active Problem List   Diagnosis Date Noted   S/P total knee arthroplasty, left 04/22/2023    PCP: Furin, Caryl Asp, FNP   REFERRING PROVIDER: Durene Romans, MD  REFERRING DIAG:  [phone] (ICD-10-CM) - S/P total knee arthroplasty, left    THERAPY DIAG:  Muscle weakness (generalized)  Abnormality of gait and mobility  Difficulty in walking, not elsewhere classified  Stiffness of left knee, not elsewhere classified  Rationale for Evaluation and Treatment Rehabilitation  PERTINENT HISTORY: Pt is a pleasant 62 y.o. s/p L TKA on 04/22/23. Pt denies falls since being discharged from hospital on 04/23/23 and pain is well controlled. Using RW correctly and consistently reporting spouse is helping him as needed. Excited for rehab process and return to  [facility].   PAIN:  Are you having pain? Yes: NPRS scale: 6/10 Pain location: Anterior knee and quad Pain description: Sharp Aggravating factors: rest Relieving factors: mobility   PRECAUTIONS: Knee   WEIGHT BEARING RESTRICTIONS: Yes WBAT   FALLS:  Has patient fallen in last 6 months? No   LIVING ENVIRONMENT: Lives with: lives with their spouse Lives in: House/apartment Stairs: Yes: External: 3-4 steps; on right going up, on left going up, and can reach both Has following equipment at home: Single point cane, Walker - 2 wheeled, bed side commode, and Grab bars   OCCUPATION: Works part time   [facility]: Independent    PRECAUTIONS: S/p L TKA 04/02/23   SUBJECTIVE:  SUBJECTIVE STATEMENT:  Patient reports 5/10 pain at rest; he reports pain up to 7/10 with mobilizing his knee. He reports compliance with his HEP.    PAIN:  Are you having pain? Yes: NPRS scale: 7/10    OBJECTIVE: (objective measures completed at initial evaluation unless otherwise dated)   DIAGNOSTIC FINDINGS: N/A   PATIENT SURVEYS:  FOTO 36 with target score of 61   COGNITION: Overall cognitive status: Within functional limits for tasks assessed                         SENSATION: WFL   EDEMA:  Circumferential: R: 16", L: 17.25"     POSTURE: No Significant postural limitations   PALPATION: Generally around L artificial knee joint.    LOWER EXTREMITY ROM:   Active ROM Right eval Left eval  Hip flexion > 90  > 90  Hip extension      Hip abduction WNL WNL  Hip adduction      Hip internal rotation      Hip external rotation      Knee flexion   65 AROM/ 80 PROM  Knee extension 0 6  Ankle dorsiflexion WNL WNL  Ankle plantarflexion      Ankle inversion      Ankle eversion       (Blank rows = not tested)   LOWER  EXTREMITY MMT:   MMT Right eval Left eval  Hip flexion 5 2  Hip extension      Hip abduction 5 4  Hip adduction 5 4  Hip internal rotation      Hip external rotation      Knee flexion 5 4  Knee extension 5 2  Ankle dorsiflexion 5 5  Ankle plantarflexion 5 5  Ankle inversion      Ankle eversion       (Blank rows = not tested)     FUNCTIONAL TESTS:  5 times sit to stand: 35.38 seconds 10 meter walk test: 0.32 m/s using RW   GAIT: Distance walked: 10 meters Assistive device utilized: Walker - 2 wheeled Level of assistance: Modified independence Comments: Consistent step through gait and L heel strike at initial contact. Relies on heavy BUE support on RW but good weight acceptance on LLE throughout stance phase.       TODAY'S TREATMENT:                                                                                                                              DATE: 05/01/2023     Manual Therapy - for symptom modulation, soft tissue sensitivity and mobility, joint mobility, ROM   Patellar mobilization; 2 x 30 sec ea dir, gr III for joint mobility  L knee PROM in supine; x 10 bouts with flexion/extension as tolerated Tibiofemoral mobilization posterior for knee flexion; gr II-III; 2 x 30 sec bouts per grade in hooklying and then in sitting  position L knee flexion over edge of bed with 60 second hold   AROM: -3-70 PROM: -3-83   Therapeutic Exercise - for improved soft tissue flexibility and extensibility as needed for ROM, improved strength as needed to improve performance of CKC activities/functional movements  Quad set, x 10, 5 sec hold; folded towel behind knee   -excellent quad contraction SLR attempted, unable to lift  Heel slide AAROM with bedsheet looped around foot; x10, 5 sec hold Short arc quad, blue bolster under knee; therapist assist to initiate motion; 2x8  *next visit* // bars: Forward stepping with verbal cueing and demo for heel heel strike at initial  contact; 4x D/B length of bars Sidestepping; 3x D/B length of bars   PATIENT EDUCATION: Discussed existing HEP and reviewed knee extension stretch with foot propped on ottoman/foot stool. Discussed prognosis and POC    ASSESSMENT:   CLINICAL IMPRESSION: Patient still has notable quad weakness/inhibition as demonstrated by inability to perform SLR. Quad contraction is improving significantly compared to IE. He is progressing with knee ROM appropriately given early post-op time. Pt demonstrates WFL active knee extension when lying on table. Pt is still stiff/limited with motion c knee flexion, though this is expected at this time. Pt has remaining deficits in LLE strength with significant limitations in quad activation with quad lag via SLR, limitations in L knee A/PROM, acute edema, acute L knee pain, and gait changes. Pt will benefit from skilled PT services to address acute deficits to return to PLOF and meet all PT goals.    OBJECTIVE IMPAIRMENTS: Abnormal gait, decreased activity tolerance, decreased balance, decreased endurance, decreased knowledge of condition, decreased knowledge of use of DME, decreased mobility, difficulty walking, decreased ROM, decreased strength, decreased safety awareness, hypomobility, increased edema, impaired flexibility, and pain.    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    REHAB POTENTIAL: Good   CLINICAL DECISION MAKING: Stable/uncomplicated   EVALUATION COMPLEXITY: Low     GOALS: Goals reviewed with patient? No   SHORT TERM GOALS: Target date: 06/21/23 Pt will  be independent with HEP to improve L knee AROM and strength Baseline: 04/24/23: reviewed hospital based HEP Goal status: INITIAL   2. Pt will demo ability to complete LLE SLR without absence of quad lag.             Baseline: 04/24/23: significant quad lag            Goal status: INITIAL   LONG TERM GOALS: Target date: 06/19/23   Pt will improve FOTO to target score to demonstrate clinically significant improvement in functional mobility. Baseline: 36 with target score of 61 Goal status: INITIAL   2.  Pt will improve L knee AROM from 0- >/= 120 to demonstrate full return to knee mobility for functional mobility tasks Baseline: 04/24/23: 6-65 AROM Goal status: INITIAL   3.  Pt will improve gait speed to >/= 1.0 m/s with LRAD to demonstrate reduced falls risk with short community distances.  Baseline: 04/24/23: Relies on RW completing .32 m/s Goal status: INITIAL   4.  Pt will improve 5xSTS to 12 seconds or less to demonstrate clinically significant improvement in LE strength  Baseline: 04/24/23: 35.38 seconds with  reduced WB'ing on LLE. Goal status: INITIAL     PLAN:   PT FREQUENCY: 2x/week   PT DURATION: 8 weeks   PLANNED INTERVENTIONS: Therapeutic exercises, Therapeutic activity, Neuromuscular re-education, Balance training, Gait training, Patient/Family education, Self Care, Joint mobilization, DME instructions, Dry Needling, Electrical stimulation, Cryotherapy, Moist heat, scar mobilization, Manual therapy, and Re-evaluation   PLAN FOR NEXT SESSION: Review HEP packet from hospital and progress HEP as able, L knee ROM, quadriceps activation. Advance gait with LRAD.    Consuela Mimes, PT, DPT #[id number] Gertie Exon, PT 05/01/2023, 5:21 PM

## 2023-05-06 ENCOUNTER — Ambulatory Visit: Payer: Medicare HMO | Admitting: Physical Therapy

## 2023-05-06 DIAGNOSIS — R262 Difficulty in walking, not elsewhere classified: Secondary | ICD-10-CM

## 2023-05-06 DIAGNOSIS — M6281 Muscle weakness (generalized): Secondary | ICD-10-CM

## 2023-05-06 DIAGNOSIS — M25662 Stiffness of left knee, not elsewhere classified: Secondary | ICD-10-CM

## 2023-05-06 DIAGNOSIS — R269 Unspecified abnormalities of gait and mobility: Secondary | ICD-10-CM

## 2023-05-06 NOTE — Therapy (Unsigned)
OUTPATIENT PHYSICAL THERAPY TREATMENT NOTE   Patient Name: Ronald Whitaker MRN: [id number] DOB:03/12/1961, 62 y.o., male Today's Date: 05/06/2023  END OF SESSION:   PT End of Session - 05/06/23 1605     Visit Number 4    Number of Visits 17    Date for PT Re-Evaluation 06/19/23    PT Start Time 1603    PT Stop Time 1643    PT Time Calculation (min) 40 min    Equipment Utilized During Treatment Gait belt    Activity Tolerance Patient tolerated treatment well    Behavior During Therapy WFL for tasks assessed/performed               Past Medical History:  Diagnosis Date   Arthritis    Atrial fibrillation (HCC)    Diabetes mellitus without complication (HCC)    Gout    Hypertension    Hypothyroidism    MI, old    Thyroid disease    hypothyroid   Past Surgical History:  Procedure Laterality Date   BACK SURGERY     CARDIAC CATHETERIZATION     CATARACT EXTRACTION W/ INTRAOCULAR LENS IMPLANT Bilateral    CORONARY ARTERY BYPASS GRAFT     2014   TOTAL KNEE ARTHROPLASTY Left 04/22/2023   Procedure: TOTAL KNEE ARTHROPLASTY;  Surgeon: Durene Romans, MD;  Location: [facility];  Service: Orthopedics;  Laterality: Left;   Patient Active Problem List   Diagnosis Date Noted   S/P total knee arthroplasty, left 04/22/2023    PCP: Furin, Caryl Asp, FNP   REFERRING PROVIDER: Durene Romans, MD  REFERRING DIAG:  [phone] (ICD-10-CM) - S/P total knee arthroplasty, left    THERAPY DIAG:  Muscle weakness (generalized)  Abnormality of gait and mobility  Difficulty in walking, not elsewhere classified  Stiffness of left knee, not elsewhere classified  Rationale for Evaluation and Treatment Rehabilitation  PERTINENT HISTORY: Pt is a pleasant 62 y.o. s/p L TKA on 04/22/23. Pt denies falls since being discharged from hospital on 04/23/23 and pain is well controlled. Using RW correctly and consistently reporting spouse is helping him as needed. Excited for rehab process and return to  [facility].   PAIN:  Are you having pain? Yes: NPRS scale: 6/10 Pain location: Anterior knee and quad Pain description: Sharp Aggravating factors: rest Relieving factors: mobility   PRECAUTIONS: Knee   WEIGHT BEARING RESTRICTIONS: Yes WBAT   FALLS:  Has patient fallen in last 6 months? No   LIVING ENVIRONMENT: Lives with: lives with their spouse Lives in: House/apartment Stairs: Yes: External: 3-4 steps; on right going up, on left going up, and can reach both Has following equipment at home: Single point cane, Walker - 2 wheeled, bed side commode, and Grab bars   OCCUPATION: Works part time   [facility]: Independent    PRECAUTIONS: S/p L TKA 04/02/23   SUBJECTIVE:  SUBJECTIVE STATEMENT:  Patient reports doing well yesterday and completing more weightbearing activity and taking some steps without his walker. He reports having increase in pain and swelling this AM.    PAIN:  Are you having pain? Yes: NPRS scale: 5/10    OBJECTIVE: (objective measures completed at initial evaluation unless otherwise dated)   DIAGNOSTIC FINDINGS: N/A   PATIENT SURVEYS:  FOTO 36 with target score of 61   COGNITION: Overall cognitive status: Within functional limits for tasks assessed                         SENSATION: WFL   EDEMA:  Circumferential: R: 16", L: 17.25"     POSTURE: No Significant postural limitations   PALPATION: Generally around L artificial knee joint.    LOWER EXTREMITY ROM:   Active ROM Right eval Left eval  Hip flexion > 90  > 90  Hip extension      Hip abduction WNL WNL  Hip adduction      Hip internal rotation      Hip external rotation      Knee flexion   65 AROM/ 80 PROM  Knee extension 0 6  Ankle dorsiflexion WNL WNL  Ankle plantarflexion      Ankle inversion      Ankle  eversion       (Blank rows = not tested)   LOWER EXTREMITY MMT:   MMT Right eval Left eval  Hip flexion 5 2  Hip extension      Hip abduction 5 4  Hip adduction 5 4  Hip internal rotation      Hip external rotation      Knee flexion 5 4  Knee extension 5 2  Ankle dorsiflexion 5 5  Ankle plantarflexion 5 5  Ankle inversion      Ankle eversion       (Blank rows = not tested)     FUNCTIONAL TESTS:  5 times sit to stand: 35.38 seconds 10 meter walk test: 0.32 m/s using RW   GAIT: Distance walked: 10 meters Assistive device utilized: Walker - 2 wheeled Level of assistance: Modified independence Comments: Consistent step through gait and L heel strike at initial contact. Relies on heavy BUE support on RW but good weight acceptance on LLE throughout stance phase.       TODAY'S TREATMENT:                                                                                                                              DATE: 05/06/2023     Manual Therapy - for symptom modulation, soft tissue sensitivity and mobility, joint mobility, ROM   Patellar mobilization; 1 x 30 sec ea dir, gr III for joint mobility   -poor tolerance of direct pressure along patellar border today L knee PROM in supine; x 5 bouts with flexion/extension as tolerated  -poor tolerance of flexion today  Tibiofemoral mobilization posterior for knee flexion; gr II-III; 2 x 30 sec bouts in sitting position L knee flexion over edge of bed with 2 x 30 sec  AROM: -2-75 PROM: -2-83   Therapeutic Exercise - for improved soft tissue flexibility and extensibility as needed for ROM, improved strength as needed to improve performance of CKC activities/functional movements  Quad set; reviewed today  Heel slide AAROM with bedsheet looped around foot; x10, 5 sec hold Supine hip abduction; 2x10  -tactile and verbal cueing for technique  Short arc quad, blue bolster under knee; therapist assist to initiate motion;  2x8 Long arc quad; edge of table; 2x8  -gentle assist to attain full knee extension   // bars: Forward stepping with verbal cueing and demo for full knee extension and heel heel strike at initial contact; 4x D/B length of bars Sidestepping; 2x D/B length of bars   *not today* SLR attempted, unable to lift     PATIENT EDUCATION: Discussed scaling back physical activity 25% to reduce likelihood of heightened pain and swelling, continuing regular use of cryotherapy at home and elevation each day.     ASSESSMENT:   CLINICAL IMPRESSION: Patient has limited tolerance of knee flexion ROM today with increase in swelling and increase in pain today. He has pitting edema along tibiofemoral joint line. Pt has no other DVT signs and negative Homan's sign at this time. Knee flexion ROM is the same as last visit with mobilizing and passive stretching. Pt has good extension and improving quadiceps activation. Pt has remaining deficits in LLE strength with significant limitations in quad activation with quad lag via SLR, limitations in L knee A/PROM, acute edema, acute L knee pain, and gait changes. Pt will benefit from skilled PT services to address acute deficits to return to PLOF and meet all PT goals.    OBJECTIVE IMPAIRMENTS: Abnormal gait, decreased activity tolerance, decreased balance, decreased endurance, decreased knowledge of condition, decreased knowledge of use of DME, decreased mobility, difficulty walking, decreased ROM, decreased strength, decreased safety awareness, hypomobility, increased edema, impaired flexibility, and pain.    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    REHAB POTENTIAL: Good   CLINICAL DECISION MAKING: Stable/uncomplicated   EVALUATION COMPLEXITY: Low     GOALS: Goals reviewed with patient? No   SHORT TERM GOALS: Target date: 06/21/23 Pt will be independent with HEP to improve L knee AROM and strength Baseline: 04/24/23: reviewed hospital based HEP Goal status: INITIAL   2. Pt will demo ability to complete LLE SLR without absence of quad lag.             Baseline: 04/24/23: significant quad lag            Goal status: INITIAL   LONG TERM GOALS: Target date: 06/19/23   Pt will improve FOTO to target score to demonstrate clinically significant improvement in functional mobility. Baseline: 36 with target score of 61 Goal status: INITIAL   2.  Pt will improve L knee AROM from 0- >/= 120 to demonstrate full return to knee mobility for functional mobility tasks Baseline: 04/24/23: 6-65 AROM Goal status: INITIAL   3.  Pt will improve gait speed to >/= 1.0 m/s  with LRAD to demonstrate reduced falls risk with short community distances.  Baseline: 04/24/23: Relies on RW completing .32 m/s Goal status: INITIAL   4.  Pt will improve 5xSTS to 12 seconds or less to demonstrate clinically significant improvement in LE strength  Baseline: 04/24/23: 35.38 seconds with reduced WB'ing on LLE. Goal status: INITIAL     PLAN:   PT FREQUENCY: 2x/week   PT DURATION: 8 weeks   PLANNED INTERVENTIONS: Therapeutic exercises, Therapeutic activity, Neuromuscular re-education, Balance training, Gait training, Patient/Family education, Self Care, Joint mobilization, DME instructions, Dry Needling, Electrical stimulation, Cryotherapy, Moist heat, scar mobilization, Manual therapy, and Re-evaluation   PLAN FOR NEXT SESSION: Review HEP packet from hospital and progress HEP as able, L knee ROM, quadriceps activation. Advance gait with LRAD.    Consuela Mimes, PT, DPT #[id number] Gertie Exon, PT 05/06/2023, 4:05 PM

## 2023-05-08 ENCOUNTER — Ambulatory Visit: Payer: Medicare HMO | Admitting: Physical Therapy

## 2023-05-08 ENCOUNTER — Encounter: Payer: Self-pay | Admitting: Physical Therapy

## 2023-05-08 NOTE — Therapy (Deleted)
OUTPATIENT PHYSICAL THERAPY TREATMENT NOTE   Patient Name: Ronald Whitaker MRN: [id number] DOB:1961/10/28, 62 y.o., male Today's Date: 05/08/2023  END OF SESSION:       Past Medical History:  Diagnosis Date   Arthritis    Atrial fibrillation (HCC)    Diabetes mellitus without complication (HCC)    Gout    Hypertension    Hypothyroidism    MI, old    Thyroid disease    hypothyroid   Past Surgical History:  Procedure Laterality Date   BACK SURGERY     CARDIAC CATHETERIZATION     CATARACT EXTRACTION W/ INTRAOCULAR LENS IMPLANT Bilateral    CORONARY ARTERY BYPASS GRAFT     2014   TOTAL KNEE ARTHROPLASTY Left 04/22/2023   Procedure: TOTAL KNEE ARTHROPLASTY;  Surgeon: Durene Romans, MD;  Location: [facility];  Service: Orthopedics;  Laterality: Left;   Patient Active Problem List   Diagnosis Date Noted   S/P total knee arthroplasty, left 04/22/2023    PCP: Furin, Caryl Asp, FNP   REFERRING PROVIDER: Durene Romans, MD  REFERRING DIAG:  [phone] (ICD-10-CM) - S/P total knee arthroplasty, left    THERAPY DIAG:  Muscle weakness (generalized)  Abnormality of gait and mobility  Difficulty in walking, not elsewhere classified  Stiffness of left knee, not elsewhere classified  Rationale for Evaluation and Treatment Rehabilitation  PERTINENT HISTORY: Pt is a pleasant 62 y.o. s/p L TKA on 04/22/23. Pt denies falls since being discharged from hospital on 04/23/23 and pain is well controlled. Using RW correctly and consistently reporting spouse is helping him as needed. Excited for rehab process and return to PLOF.   PAIN:  Are you having pain? Yes: NPRS scale: 6/10 Pain location: Anterior knee and quad Pain description: Sharp Aggravating factors: rest Relieving factors: mobility   PRECAUTIONS: Knee   WEIGHT BEARING RESTRICTIONS: Yes WBAT   FALLS:  Has patient fallen in last 6 months? No   LIVING ENVIRONMENT: Lives with: lives with their spouse Lives in:  House/apartment Stairs: Yes: External: 3-4 steps; on right going up, on left going up, and can reach both Has following equipment at home: Single point cane, Walker - 2 wheeled, bed side commode, and Grab bars   OCCUPATION: Works part time   PLOF: Independent    PRECAUTIONS: S/p L TKA 04/02/23   SUBJECTIVE:                                                                                                                                                                                      SUBJECTIVE STATEMENT:  Patient reports doing well yesterday and completing more weightbearing activity and taking some steps without  his walker. He reports having increase in pain and swelling this AM.    PAIN:  Are you having pain? Yes: NPRS scale: 5/10    OBJECTIVE: (objective measures completed at initial evaluation unless otherwise dated)   DIAGNOSTIC FINDINGS: N/A   PATIENT SURVEYS:  FOTO 36 with target score of 61   COGNITION: Overall cognitive status: Within functional limits for tasks assessed                         SENSATION: WFL   EDEMA:  Circumferential: R: 16", L: 17.25"     POSTURE: No Significant postural limitations   PALPATION: Generally around L artificial knee joint.    LOWER EXTREMITY ROM:   Active ROM Right eval Left eval  Hip flexion > 90  > 90  Hip extension      Hip abduction WNL WNL  Hip adduction      Hip internal rotation      Hip external rotation      Knee flexion   65 AROM/ 80 PROM  Knee extension 0 6  Ankle dorsiflexion WNL WNL  Ankle plantarflexion      Ankle inversion      Ankle eversion       (Blank rows = not tested)   LOWER EXTREMITY MMT:   MMT Right eval Left eval  Hip flexion 5 2  Hip extension      Hip abduction 5 4  Hip adduction 5 4  Hip internal rotation      Hip external rotation      Knee flexion 5 4  Knee extension 5 2  Ankle dorsiflexion 5 5  Ankle plantarflexion 5 5  Ankle inversion      Ankle eversion        (Blank rows = not tested)     FUNCTIONAL TESTS:  5 times sit to stand: 35.38 seconds 10 meter walk test: 0.32 m/s using RW   GAIT: Distance walked: 10 meters Assistive device utilized: Walker - 2 wheeled Level of assistance: Modified independence Comments: Consistent step through gait and L heel strike at initial contact. Relies on heavy BUE support on RW but good weight acceptance on LLE throughout stance phase.       TODAY'S TREATMENT:                                                                                                                              DATE: 05/08/2023     Manual Therapy - for symptom modulation, soft tissue sensitivity and mobility, joint mobility, ROM   Patellar mobilization; 1 x 30 sec ea dir, gr III for joint mobility   -poor tolerance of direct pressure along patellar border today L knee PROM in supine; x 5 bouts with flexion/extension as tolerated  -poor tolerance of flexion today  Tibiofemoral mobilization posterior for knee flexion; gr II-III; 2 x 30 sec bouts in sitting position L  knee flexion over edge of bed with 2 x 30 sec  AROM: -2-75 PROM: -2-83   Therapeutic Exercise - for improved soft tissue flexibility and extensibility as needed for ROM, improved strength as needed to improve performance of CKC activities/functional movements  Quad set; reviewed today  Heel slide AAROM with bedsheet looped around foot; x10, 5 sec hold Supine hip abduction; 2x10  -tactile and verbal cueing for technique  Short arc quad, blue bolster under knee; therapist assist to initiate motion; 2x8 Long arc quad; edge of table; 2x8  -gentle assist to attain full knee extension   // bars: Forward stepping with verbal cueing and demo for full knee extension and heel heel strike at initial contact; 4x D/B length of bars Sidestepping; 2x D/B length of bars   *not today* SLR attempted, unable to lift     PATIENT EDUCATION: Discussed scaling back physical  activity 25% to reduce likelihood of heightened pain and swelling, continuing regular use of cryotherapy at home and elevation each day.     ASSESSMENT:   CLINICAL IMPRESSION: Patient has limited tolerance of knee flexion ROM today with increase in swelling and increase in pain today. He has pitting edema along tibiofemoral joint line. Pt has no other DVT signs and negative Homan's sign at this time. Knee flexion ROM is the same as last visit with mobilizing and passive stretching. Pt has good extension and improving quadiceps activation. Pt has remaining deficits in LLE strength with significant limitations in quad activation with quad lag via SLR, limitations in L knee A/PROM, acute edema, acute L knee pain, and gait changes. Pt will benefit from skilled PT services to address acute deficits to return to PLOF and meet all PT goals.    OBJECTIVE IMPAIRMENTS: Abnormal gait, decreased activity tolerance, decreased balance, decreased endurance, decreased knowledge of condition, decreased knowledge of use of DME, decreased mobility, difficulty walking, decreased ROM, decreased strength, decreased safety awareness, hypomobility, increased edema, impaired flexibility, and pain.    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    REHAB POTENTIAL: Good   CLINICAL DECISION MAKING: Stable/uncomplicated   EVALUATION COMPLEXITY: Low     GOALS: Goals reviewed with patient? No   SHORT TERM GOALS: Target date: 06/21/23 Pt will be independent with HEP to improve L knee AROM and strength Baseline: 04/24/23: reviewed  hospital based HEP Goal status: INITIAL   2. Pt will demo ability to complete LLE SLR without absence of quad lag.             Baseline: 04/24/23: significant quad lag            Goal status: INITIAL   LONG TERM GOALS: Target date: 06/19/23   Pt will improve FOTO to target score to demonstrate clinically significant improvement in functional mobility. Baseline: 36 with target score of 61 Goal status: INITIAL   2.  Pt will improve L knee AROM from 0- >/= 120 to demonstrate full return to knee mobility for functional mobility tasks Baseline: 04/24/23: 6-65 AROM Goal status: INITIAL   3.  Pt will improve gait speed to >/= 1.0 m/s with LRAD to demonstrate reduced falls risk with short community distances.  Baseline: 04/24/23: Relies on RW  completing .32 m/s Goal status: INITIAL   4.  Pt will improve 5xSTS to 12 seconds or less to demonstrate clinically significant improvement in LE strength  Baseline: 04/24/23: 35.38 seconds with reduced WB'ing on LLE. Goal status: INITIAL     PLAN:   PT FREQUENCY: 2x/week   PT DURATION: 8 weeks   PLANNED INTERVENTIONS: Therapeutic exercises, Therapeutic activity, Neuromuscular re-education, Balance training, Gait training, Patient/Family education, Self Care, Joint mobilization, DME instructions, Dry Needling, Electrical stimulation, Cryotherapy, Moist heat, scar mobilization, Manual therapy, and Re-evaluation   PLAN FOR NEXT SESSION: Review HEP packet from hospital and progress HEP as able, L knee ROM, quadriceps activation. Advance gait with LRAD.    Consuela Mimes, PT, DPT #[id number] Gertie Exon, PT 05/08/2023, 12:43 PM

## 2023-05-13 ENCOUNTER — Ambulatory Visit: Payer: Medicare HMO | Admitting: Physical Therapy

## 2023-05-15 ENCOUNTER — Encounter: Payer: Self-pay | Admitting: Physical Therapy

## 2023-05-15 ENCOUNTER — Ambulatory Visit: Payer: Medicare HMO | Admitting: Physical Therapy

## 2023-05-15 DIAGNOSIS — R269 Unspecified abnormalities of gait and mobility: Secondary | ICD-10-CM

## 2023-05-15 DIAGNOSIS — M6281 Muscle weakness (generalized): Secondary | ICD-10-CM

## 2023-05-15 DIAGNOSIS — M25662 Stiffness of left knee, not elsewhere classified: Secondary | ICD-10-CM

## 2023-05-15 DIAGNOSIS — R262 Difficulty in walking, not elsewhere classified: Secondary | ICD-10-CM

## 2023-05-15 NOTE — Therapy (Signed)
OUTPATIENT PHYSICAL THERAPY TREATMENT NOTE   Patient Name: Ronald Whitaker MRN: [id number] DOB:Jun 21, 1961, 62 y.o., male Today's Date: 05/15/2023  END OF SESSION:   PT End of Session - 05/15/23 1654     Visit Number 5    Number of Visits 17    Date for PT Re-Evaluation 06/19/23    PT Start Time 1654    PT Stop Time 1732    PT Time Calculation (min) 38 min    Equipment Utilized During Treatment Gait belt    Activity Tolerance Patient tolerated treatment well    Behavior During Therapy WFL for tasks assessed/performed               Past Medical History:  Diagnosis Date   Arthritis    Atrial fibrillation (HCC)    Diabetes mellitus without complication (HCC)    Gout    Hypertension    Hypothyroidism    MI, old    Thyroid disease    hypothyroid   Past Surgical History:  Procedure Laterality Date   BACK SURGERY     CARDIAC CATHETERIZATION     CATARACT EXTRACTION W/ INTRAOCULAR LENS IMPLANT Bilateral    CORONARY ARTERY BYPASS GRAFT     2014   TOTAL KNEE ARTHROPLASTY Left 04/22/2023   Procedure: TOTAL KNEE ARTHROPLASTY;  Surgeon: Durene Romans, MD;  Location: [facility];  Service: Orthopedics;  Laterality: Left;   Patient Active Problem List   Diagnosis Date Noted   S/P total knee arthroplasty, left 04/22/2023    PCP: Furin, Caryl Asp, FNP   REFERRING PROVIDER: Durene Romans, MD  REFERRING DIAG:  [phone] (ICD-10-CM) - S/P total knee arthroplasty, left    THERAPY DIAG:  Muscle weakness (generalized)  Abnormality of gait and mobility  Difficulty in walking, not elsewhere classified  Stiffness of left knee, not elsewhere classified  Rationale for Evaluation and Treatment Rehabilitation  PERTINENT HISTORY: Pt is a pleasant 62 y.o. s/p L TKA on 04/22/23. Pt denies falls since being discharged from hospital on 04/23/23 and pain is well controlled. Using RW correctly and consistently reporting spouse is helping him as needed. Excited for rehab process and return to  PLOF.   PAIN:  Are you having pain? Yes: NPRS scale: 6/10 Pain location: Anterior knee and quad Pain description: Sharp Aggravating factors: rest Relieving factors: mobility   PRECAUTIONS: Knee   WEIGHT BEARING RESTRICTIONS: Yes WBAT   FALLS:  Has patient fallen in last 6 months? No   LIVING ENVIRONMENT: Lives with: lives with their spouse Lives in: House/apartment Stairs: Yes: External: 3-4 steps; on right going up, on left going up, and can reach both Has following equipment at home: Single point cane, Walker - 2 wheeled, bed side commode, and Grab bars   OCCUPATION: Works part time   PLOF: Independent    PRECAUTIONS: S/p L TKA 04/02/23   SUBJECTIVE:  SUBJECTIVE STATEMENT:  Patient reports he took oxycodone prior to arrival. He feels he is doing better than last visit; he reports less swelling. Pt is compliant with HEP. He feels he is not as reliant on his walker and has seldom forgotten about it in his home.    PAIN:  Are you having pain? Yes: NPRS scale: 5/10 at arrival    OBJECTIVE: (objective measures completed at initial evaluation unless otherwise dated)   DIAGNOSTIC FINDINGS: N/A   PATIENT SURVEYS:  FOTO 36 with target score of 61   COGNITION: Overall cognitive status: Within functional limits for tasks assessed                         SENSATION: WFL   EDEMA:  Circumferential: R: 16", L: 17.25"     POSTURE: No Significant postural limitations   PALPATION: Generally around L artificial knee joint.    LOWER EXTREMITY ROM:   Active ROM Right eval Left eval  Hip flexion > 90  > 90  Hip extension      Hip abduction WNL WNL  Hip adduction      Hip internal rotation      Hip external rotation      Knee flexion   65 AROM/ 80 PROM  Knee extension 0 6  Ankle  dorsiflexion WNL WNL  Ankle plantarflexion      Ankle inversion      Ankle eversion       (Blank rows = not tested)   LOWER EXTREMITY MMT:   MMT Right eval Left eval  Hip flexion 5 2  Hip extension      Hip abduction 5 4  Hip adduction 5 4  Hip internal rotation      Hip external rotation      Knee flexion 5 4  Knee extension 5 2  Ankle dorsiflexion 5 5  Ankle plantarflexion 5 5  Ankle inversion      Ankle eversion       (Blank rows = not tested)     FUNCTIONAL TESTS:  5 times sit to stand: 35.38 seconds 10 meter walk test: 0.32 m/s using RW   GAIT: Distance walked: 10 meters Assistive device utilized: Walker - 2 wheeled Level of assistance: Modified independence Comments: Consistent step through gait and L heel strike at initial contact. Relies on heavy BUE support on RW but good weight acceptance on LLE throughout stance phase.       TODAY'S TREATMENT:                                                                                                                              DATE: 05/16/2023     Manual Therapy - for symptom modulation, soft tissue sensitivity and mobility, joint mobility, ROM   Patellar mobilization; 2 x 30 sec ea dir, gr III for joint mobility  L knee PROM in supine; x 20 bouts  with flexion/extension as tolerated  Sitting edge of table: Tibiofemoral mobilization posterior for knee flexion; gr II-III; 2 x 30 sec bouts in sitting position L knee flexion over edge of table with 2 x 30 sec holds  AROM: -2-80 PROM: -4-94   Therapeutic Exercise - for improved soft tissue flexibility and extensibility as needed for ROM, improved strength as needed to improve performance of CKC activities/functional movements  NuStep for knee AAROM; x 3 minutes, Level 0 resistance  Heel slide AAROM with gait belt looped around foot; x10, 5 sec hold  SLR; 2x5  -tactile and verbal cueing from therapist to maintain full knee extension  // bars: Forward  stepping with verbal cueing and demo for full knee extension and heel heel strike at initial contact; 4x D/B length of bars Sidestepping; 4x D/B length of bars Toe tapping on 6-inch step; 2x10 alternating    PATIENT EDUCATION: HEP update and review. We discussed expected progress moving forward with PT.    *not today* Short arc quad, blue bolster under knee; therapist assist to initiate motion; 2x8 Long arc quad; edge of table; 2x8  -gentle assist to attain full knee extension  Supine hip abduction; 2x10  -tactile and verbal cueing for technique  SLR attempted, unable to lift     HOME EXERCISE PROGRAM Access Code: [email or web address] URL: [email or web address] Date: 05/15/2023 Prepared by: Consuela Mimes  Exercises - Supine Quad Set  - 2 x daily - 7 x weekly - 2 sets - 10 reps - 5sec hold - Supine Heel Slide with Strap  - 2 x daily - 7 x weekly - 2 sets - 10 reps - 5sec hold - Supine Knee Extension Strengthening  - 2 x daily - 7 x weekly - 2 sets - 10 reps - Seated Long Arc Quad  - 2 x daily - 7 x weekly - 2 sets - 10 reps - Seated Knee Extension Stretch with Chair  - 2 x daily - 7 x weekly - 2 sets - 2-3 minutes hold    ASSESSMENT:   CLINICAL IMPRESSION: Patient has notably improved tolerance of manual therapy, knee ROM, and weightbearing activity compared to last visit during which he was experiencing more swelling. Pt is progressing appropriately at this time with ROM and has improved quad control and ability to manage operative LE. Pt has fair patellar mobility in all planes. He is less reliant on upper body support on FWW. Pt has remaining deficits in LLE strength with significant limitations in quad activation with quad lag via SLR, limitations in L knee A/PROM, acute edema, acute L knee pain, and gait changes. Pt will benefit from skilled PT services to address acute deficits to return to PLOF and meet all PT goals.    OBJECTIVE IMPAIRMENTS: Abnormal gait, decreased  activity tolerance, decreased balance, decreased endurance, decreased knowledge of condition, decreased knowledge of use of DME, decreased mobility, difficulty walking, decreased ROM, decreased strength, decreased safety awareness, hypomobility, increased edema, impaired flexibility, and pain.    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    REHAB POTENTIAL: Good   CLINICAL DECISION MAKING: Stable/uncomplicated   EVALUATION COMPLEXITY: Low     GOALS: Goals reviewed with patient? No  SHORT TERM GOALS: Target date: 06/21/23 Pt will be independent with HEP to improve L knee AROM and strength Baseline: 04/24/23: reviewed hospital based HEP Goal status: INITIAL   2. Pt will demo ability to complete LLE SLR without absence of quad lag.             Baseline: 04/24/23: significant quad lag            Goal status: INITIAL   LONG TERM GOALS: Target date: 06/19/23   Pt will improve FOTO to target score to demonstrate clinically significant improvement in functional mobility. Baseline: 36 with target score of 61 Goal status: INITIAL   2.  Pt will improve L knee AROM from 0- >/= 120 to demonstrate full return to knee mobility for functional mobility tasks Baseline: 04/24/23: 6-65 AROM Goal status: INITIAL   3.  Pt will improve gait speed to >/= 1.0 m/s with LRAD to demonstrate reduced falls risk with short community distances.  Baseline: 04/24/23: Relies on RW completing .32 m/s Goal status: INITIAL   4.  Pt will improve 5xSTS to 12 seconds or less to demonstrate clinically significant improvement in LE  strength  Baseline: 04/24/23: 35.38 seconds with reduced WB'ing on LLE. Goal status: INITIAL     PLAN:   PT FREQUENCY: 2x/week   PT DURATION: 8 weeks   PLANNED INTERVENTIONS: Therapeutic exercises, Therapeutic activity, Neuromuscular re-education, Balance training, Gait training, Patient/Family education, Self Care, Joint mobilization, DME instructions, Dry Needling, Electrical stimulation, Cryotherapy, Moist heat, scar mobilization, Manual therapy, and Re-evaluation   PLAN FOR NEXT SESSION: L knee ROM, quadriceps activation. Advance gait with LRAD and continue with progressive weightbearing activity as tolerated.    Consuela Mimes, PT, DPT #[id number] Gertie Exon, PT 05/16/2023, 4:35 PM

## 2023-05-20 ENCOUNTER — Encounter: Payer: Self-pay | Admitting: Physical Therapy

## 2023-05-20 ENCOUNTER — Ambulatory Visit: Payer: Medicare HMO | Admitting: Physical Therapy

## 2023-05-20 DIAGNOSIS — M6281 Muscle weakness (generalized): Secondary | ICD-10-CM | POA: Diagnosis not present

## 2023-05-20 DIAGNOSIS — M25662 Stiffness of left knee, not elsewhere classified: Secondary | ICD-10-CM

## 2023-05-20 DIAGNOSIS — R262 Difficulty in walking, not elsewhere classified: Secondary | ICD-10-CM

## 2023-05-20 DIAGNOSIS — R269 Unspecified abnormalities of gait and mobility: Secondary | ICD-10-CM

## 2023-05-20 NOTE — Therapy (Unsigned)
OUTPATIENT PHYSICAL THERAPY TREATMENT NOTE   Patient Name: Ronald Whitaker MRN: [id number] DOB:01/22/1961, 62 y.o., male Today's Date: 05/20/2023   END OF SESSION:   PT End of Session - 05/20/23 1651     Visit Number 6    Number of Visits 17    Date for PT Re-Evaluation 06/19/23    PT Start Time 1645    PT Stop Time 1731    PT Time Calculation (min) 46 min    Equipment Utilized During Treatment Gait belt    Activity Tolerance Patient tolerated treatment well    Behavior During Therapy WFL for tasks assessed/performed             Past Medical History:  Diagnosis Date   Arthritis    Atrial fibrillation (HCC)    Diabetes mellitus without complication (HCC)    Gout    Hypertension    Hypothyroidism    MI, old    Thyroid disease    hypothyroid   Past Surgical History:  Procedure Laterality Date   BACK SURGERY     CARDIAC CATHETERIZATION     CATARACT EXTRACTION W/ INTRAOCULAR LENS IMPLANT Bilateral    CORONARY ARTERY BYPASS GRAFT     2014   TOTAL KNEE ARTHROPLASTY Left 04/22/2023   Procedure: TOTAL KNEE ARTHROPLASTY;  Surgeon: Durene Romans, MD;  Location: [facility];  Service: Orthopedics;  Laterality: Left;   Patient Active Problem List   Diagnosis Date Noted   S/P total knee arthroplasty, left 04/22/2023    PCP: Furin, Caryl Asp, FNP   REFERRING PROVIDER: Durene Romans, MD  REFERRING DIAG:  [phone] (ICD-10-CM) - S/P total knee arthroplasty, left    THERAPY DIAG:  Muscle weakness (generalized)  Abnormality of gait and mobility  Difficulty in walking, not elsewhere classified  Stiffness of left knee, not elsewhere classified  Rationale for Evaluation and Treatment Rehabilitation  PERTINENT HISTORY: Pt is a pleasant 62 y.o. s/p L TKA on 04/22/23. Pt denies falls since being discharged from hospital on 04/23/23 and pain is well controlled. Using RW correctly and consistently reporting spouse is helping him as needed. Excited for rehab process and return to  [facility].   PAIN:  Are you having pain? Yes: NPRS scale: 6/10 Pain location: Anterior knee and quad Pain description: Sharp Aggravating factors: rest Relieving factors: mobility   PRECAUTIONS: Knee   WEIGHT BEARING RESTRICTIONS: Yes WBAT   FALLS:  Has patient fallen in last 6 months? No   LIVING ENVIRONMENT: Lives with: lives with their spouse Lives in: House/apartment Stairs: Yes: External: 3-4 steps; on right going up, on left going up, and can reach both Has following equipment at home: Single point cane, Walker - 2 wheeled, bed side commode, and Grab bars   OCCUPATION: Works part time   PLOF: Independent    PRECAUTIONS: S/p L TKA 04/02/23   SUBJECTIVE:  SUBJECTIVE STATEMENT:  Patient reports having pain the previous evening after a lot of activity the previous day. Patient reports compliance with his HEP.    PAIN:  Are you having pain? No    OBJECTIVE: (objective measures completed at initial evaluation unless otherwise dated)   DIAGNOSTIC FINDINGS: N/A   PATIENT SURVEYS:  FOTO 36 with target score of 61   COGNITION: Overall cognitive status: Within functional limits for tasks assessed                         SENSATION: WFL   EDEMA:  Circumferential: R: 16", L: 17.25"     POSTURE: No Significant postural limitations   PALPATION: Generally around L artificial knee joint.    LOWER EXTREMITY ROM:   Active ROM Right eval Left eval  Hip flexion > 90  > 90  Hip extension      Hip abduction WNL WNL  Hip adduction      Hip internal rotation      Hip external rotation      Knee flexion   65 AROM/ 80 PROM  Knee extension 0 6  Ankle dorsiflexion WNL WNL  Ankle plantarflexion      Ankle inversion      Ankle eversion       (Blank rows = not tested)   LOWER EXTREMITY MMT:    MMT Right eval Left eval  Hip flexion 5 2  Hip extension      Hip abduction 5 4  Hip adduction 5 4  Hip internal rotation      Hip external rotation      Knee flexion 5 4  Knee extension 5 2  Ankle dorsiflexion 5 5  Ankle plantarflexion 5 5  Ankle inversion      Ankle eversion       (Blank rows = not tested)     FUNCTIONAL TESTS:  5 times sit to stand: 35.38 seconds 10 meter walk test: 0.32 m/s using RW   GAIT: Distance walked: 10 meters Assistive device utilized: Walker - 2 wheeled Level of assistance: Modified independence Comments: Consistent step through gait and L heel strike at initial contact. Relies on heavy BUE support on RW but good weight acceptance on LLE throughout stance phase.       TODAY'S TREATMENT:                                                                                                                              DATE: 05/20/2023     Manual Therapy - for symptom modulation, soft tissue sensitivity and mobility, joint mobility, ROM   Patellar mobilization; 2 x 30 sec ea dir, gr III for joint mobility  L knee PROM in supine; x 20 bouts with flexion/extension as tolerated  Sitting edge of table: Tibiofemoral mobilization posterior for knee flexion; gr II-III; 2 x 30 sec bouts in sitting position L knee flexion over  edge of table with 2 x 30 sec holds  AROM: -2-85 PROM: -5-107   Therapeutic Exercise - for improved soft tissue flexibility and extensibility as needed for ROM, improved strength as needed to improve performance of CKC activities/functional movements  NuStep for knee AAROM; x 5 minutes, Level 0 resistance  Heel slide AAROM with gait belt looped around foot; x10, 10 sec hold  SLR; 2x8  -tactile and verbal cueing from therapist to maintain full knee extension   Trial of gait with SPC in RUE following brief demo for cane placement during gait; 3 laps around gym  -pt exhibits proficient use of cane and reports using cane for  short term prior to Sx   PATIENT EDUCATION: HEP review. We discussed use of FWW for all community-level and outdoor mobility for safety at this time; we discussed practicing with SPC in closed home environment and anticipated transition to less restrictive AD over next 1-2 weeks prior to weaning from AD use overall.      *not today* // bars: Forward stepping with verbal cueing and demo for full knee extension and heel heel strike at initial contact; 4x D/B length of bars Sidestepping; 4x D/B length of bars Toe tapping on 6-inch step; 2x10 alternating Short arc quad, blue bolster under knee; therapist assist to initiate motion; 2x8 Long arc quad; edge of table; 2x8  -gentle assist to attain full knee extension  Supine hip abduction; 2x10  -tactile and verbal cueing for technique  SLR attempted, unable to lift     HOME EXERCISE PROGRAM Access Code: [email or web address] URL: [email or web address] Date: 05/15/2023 Prepared by: Consuela Mimes  Exercises - Supine Quad Set  - 2 x daily - 7 x weekly - 2 sets - 10 reps - 5sec hold - Supine Heel Slide with Strap  - 2 x daily - 7 x weekly - 2 sets - 10 reps - 5sec hold - Supine Knee Extension Strengthening  - 2 x daily - 7 x weekly - 2 sets - 10 reps - Seated Long Arc Quad  - 2 x daily - 7 x weekly - 2 sets - 10 reps - Seated Knee Extension Stretch with Chair  - 2 x daily - 7 x weekly - 2 sets - 2-3 minutes hold    ASSESSMENT:   CLINICAL IMPRESSION: Patient is relying on front-wheeled walker less for gait and is putting minimal body weight onto upper limbs during gait. We completed trial of gait with SPC, and pt demonstrates proficiency with [facility] and safety with home-level distance. We discussed practicing with cane in home environment first prior to weaning from walker; pt will likely be able to wean from walker over the next week. He is continuing to progress with knee ROM, but he still has stiffness with knee flexion and mild  deficit with terminal extension. Pt has remaining deficits in LLE strength with significant limitations in quad activation with quad lag via SLR, limitations in L knee A/PROM, acute edema, acute L knee pain, and gait changes. Pt will benefit from skilled PT services to address acute deficits to return to PLOF and meet all PT goals.    OBJECTIVE IMPAIRMENTS: Abnormal gait, decreased activity tolerance, decreased balance, decreased endurance, decreased knowledge of condition, decreased knowledge of use of DME, decreased mobility, difficulty walking, decreased ROM, decreased strength, decreased safety awareness, hypomobility, increased edema, impaired flexibility, and pain.    ACTIVITY LIMITATIONS: carrying, lifting, standing, squatting, and locomotion level   PARTICIPATION LIMITATIONS: laundry, driving, shopping, community activity,  occupation, and yard work   PERSONAL FACTORS: Age, Education, Fitness, Past/current experiences, Profession, Time since onset of injury/illness/exacerbation, and 3+ comorbidities: Hx of HTN, A-fib, CABG, DM , hypothyroidism   are also affecting patient's functional outcome.    REHAB POTENTIAL: Good   CLINICAL DECISION MAKING: Stable/uncomplicated   EVALUATION COMPLEXITY: Low     GOALS: Goals reviewed with patient? No   SHORT TERM GOALS: Target date: 06/21/23 Pt will be independent with HEP to improve L knee AROM and strength Baseline: 04/24/23: reviewed hospital based HEP Goal status: INITIAL   2. Pt will demo ability to complete LLE SLR without absence of quad lag.             Baseline: 04/24/23: significant quad lag            Goal status: INITIAL   LONG TERM GOALS: Target date: 06/19/23   Pt will improve FOTO to target score to demonstrate clinically significant improvement in functional mobility. Baseline: 36 with target score of 61 Goal status: INITIAL   2.  Pt will improve L knee AROM from 0- >/= 120 to demonstrate full return to knee mobility for  functional mobility tasks Baseline: 04/24/23: 6-65 AROM Goal status: INITIAL   3.  Pt will improve gait speed to >/= 1.0 m/s with LRAD to demonstrate reduced falls risk with short community distances.  Baseline: 04/24/23: Relies on RW completing .32 m/s Goal status: INITIAL   4.  Pt will improve 5xSTS to 12 seconds or less to demonstrate clinically significant improvement in LE strength  Baseline: 04/24/23: 35.38 seconds with reduced WB'ing on LLE. Goal status: INITIAL     PLAN:   PT FREQUENCY: 2x/week   PT DURATION: 8 weeks   PLANNED INTERVENTIONS: Therapeutic exercises, Therapeutic activity, Neuromuscular re-education, Balance training, Gait training, Patient/Family education, Self Care, Joint mobilization, DME instructions, Dry Needling, Electrical stimulation, Cryotherapy, Moist heat, scar mobilization, Manual therapy, and Re-evaluation   PLAN FOR NEXT SESSION: L knee ROM, quadriceps activation. Advance gait with LRAD and continue with progressive weightbearing activity as tolerated.    Consuela Mimes, PT, DPT #[id number] Gertie Exon, PT 05/20/2023, 5:32 PM

## 2023-05-22 ENCOUNTER — Ambulatory Visit: Payer: Medicare HMO | Admitting: Physical Therapy

## 2023-05-22 DIAGNOSIS — M6281 Muscle weakness (generalized): Secondary | ICD-10-CM

## 2023-05-22 DIAGNOSIS — R269 Unspecified abnormalities of gait and mobility: Secondary | ICD-10-CM

## 2023-05-22 DIAGNOSIS — M25662 Stiffness of left knee, not elsewhere classified: Secondary | ICD-10-CM

## 2023-05-22 DIAGNOSIS — R262 Difficulty in walking, not elsewhere classified: Secondary | ICD-10-CM

## 2023-05-22 NOTE — Therapy (Signed)
OUTPATIENT PHYSICAL THERAPY TREATMENT NOTE   Patient Name: Ronald Whitaker MRN: [id number] DOB:02/07/1961, 62 y.o., male Today's Date: 05/22/2023   END OF SESSION:   PT End of Session - 05/22/23 1642     Visit Number 7    Number of Visits 17    Date for PT Re-Evaluation 06/19/23    PT Start Time 1643    PT Stop Time 1726    PT Time Calculation (min) 43 min    Equipment Utilized During Treatment Gait belt    Activity Tolerance Patient tolerated treatment well    Behavior During Therapy WFL for tasks assessed/performed              Past Medical History:  Diagnosis Date   Arthritis    Atrial fibrillation (HCC)    Diabetes mellitus without complication (HCC)    Gout    Hypertension    Hypothyroidism    MI, old    Thyroid disease    hypothyroid   Past Surgical History:  Procedure Laterality Date   BACK SURGERY     CARDIAC CATHETERIZATION     CATARACT EXTRACTION W/ INTRAOCULAR LENS IMPLANT Bilateral    CORONARY ARTERY BYPASS GRAFT     2014   TOTAL KNEE ARTHROPLASTY Left 04/22/2023   Procedure: TOTAL KNEE ARTHROPLASTY;  Surgeon: Durene Romans, MD;  Location: [facility];  Service: Orthopedics;  Laterality: Left;   Patient Active Problem List   Diagnosis Date Noted   S/P total knee arthroplasty, left 04/22/2023    PCP: Furin, Caryl Asp, FNP   REFERRING PROVIDER: Durene Romans, MD  REFERRING DIAG:  [phone] (ICD-10-CM) - S/P total knee arthroplasty, left    THERAPY DIAG:  Muscle weakness (generalized)  Abnormality of gait and mobility  Difficulty in walking, not elsewhere classified  Stiffness of left knee, not elsewhere classified  Rationale for Evaluation and Treatment Rehabilitation  PERTINENT HISTORY: Pt is a pleasant 62 y.o. s/p L TKA on 04/22/23. Pt denies falls since being discharged from hospital on 04/23/23 and pain is well controlled. Using RW correctly and consistently reporting spouse is helping him as needed. Excited for rehab process and return to  [facility].   PAIN:  Are you having pain? Yes: NPRS scale: 6/10 Pain location: Anterior knee and quad Pain description: Sharp Aggravating factors: rest Relieving factors: mobility   PRECAUTIONS: Knee   WEIGHT BEARING RESTRICTIONS: Yes WBAT   FALLS:  Has patient fallen in last 6 months? No   LIVING ENVIRONMENT: Lives with: lives with their spouse Lives in: House/apartment Stairs: Yes: External: 3-4 steps; on right going up, on left going up, and can reach both Has following equipment at home: Single point cane, Walker - 2 wheeled, bed side commode, and Grab bars   OCCUPATION: Works part time   PLOF: Independent    PRECAUTIONS: S/p L TKA 04/22/23   SUBJECTIVE:  SUBJECTIVE STATEMENT:  Patient reports no major updates today. He reports soreness after exercise/therapy is recovering faster. Patient reports he likes to have his walker at night when going to restroom, but he feels that he can likely move to cane soon.    PAIN:  Are you having pain? No    OBJECTIVE: (objective measures completed at initial evaluation unless otherwise dated)   DIAGNOSTIC FINDINGS: N/A   PATIENT SURVEYS:  FOTO 36 with target score of 61   COGNITION: Overall cognitive status: Within functional limits for tasks assessed                         SENSATION: WFL   EDEMA:  Circumferential: R: 16", L: 17.25"     POSTURE: No Significant postural limitations   PALPATION: Generally around L artificial knee joint.    LOWER EXTREMITY ROM:   Active ROM Right eval Left eval  Hip flexion > 90  > 90  Hip extension      Hip abduction WNL WNL  Hip adduction      Hip internal rotation      Hip external rotation      Knee flexion   65 AROM/ 80 PROM  Knee extension 0 6  Ankle dorsiflexion WNL WNL  Ankle plantarflexion       Ankle inversion      Ankle eversion       (Blank rows = not tested)   LOWER EXTREMITY MMT:   MMT Right eval Left eval  Hip flexion 5 2  Hip extension      Hip abduction 5 4  Hip adduction 5 4  Hip internal rotation      Hip external rotation      Knee flexion 5 4  Knee extension 5 2  Ankle dorsiflexion 5 5  Ankle plantarflexion 5 5  Ankle inversion      Ankle eversion       (Blank rows = not tested)     FUNCTIONAL TESTS:  5 times sit to stand: 35.38 seconds 10 meter walk test: 0.32 m/s using RW   GAIT: Distance walked: 10 meters Assistive device utilized: Walker - 2 wheeled Level of assistance: Modified independence Comments: Consistent step through gait and L heel strike at initial contact. Relies on heavy BUE support on RW but good weight acceptance on LLE throughout stance phase.       TODAY'S TREATMENT:                                                                                                                              DATE: 05/22/2023     Manual Therapy - for symptom modulation, soft tissue sensitivity and mobility, joint mobility, ROM   Patellar mobilization; 2 x 30 sec ea dir, gr III for joint mobility  L knee PROM in supine; x 10 bouts with flexion/extension as tolerated Tibiofemoral mobilization posterior for knee flexion; gr  II-III; 2 x 30 sec bouts in sitting position   Sitting edge of table: Tibiofemoral mobilization posterior for knee flexion; gr II-III; 2 x 30 sec bouts in sitting position L knee flexion over edge of table with 2 x 30 sec holds  AROM: -3-97 PROM: -2-106   Therapeutic Exercise - for improved soft tissue flexibility and extensibility as needed for ROM, improved strength as needed to improve performance of CKC activities/functional movements  NuStep for knee AAROM; x 5 minutes, Level 2 resistance, seat at 7   Heel slide AAROM with gait belt looped around foot; x10, 10 sec hold   -Sit to stand performed at 60-degree  and 90-degree knee flexion without significant challenge   Gait with SPC in RUE following brief demo for cane placement during gait; 3 laps around gym  -pt exhibits proficient use of cane and reports using cane for short term prior to Sx  Toe tapping; 6-inch step, alternating R/L for 2x10 (staircase in center of gym)  -PT SBA  Minisquat; 2x10  SLR; 2x10  -tactile cueing to maintain full knee extension   PATIENT EDUCATION: HEP review. We discussed wean of FWW over the next week and working on using [facility] for most of his household/short-distance mobility over the next week followed by gradual wean from AD completely.     *not today* // bars: Forward stepping with verbal cueing and demo for full knee extension and heel heel strike at initial contact; 4x D/B length of bars Sidestepping; 4x D/B length of bars Toe tapping on 6-inch step; 2x10 alternating Short arc quad, blue bolster under knee; therapist assist to initiate motion; 2x8 Long arc quad; edge of table; 2x8  -gentle assist to attain full knee extension  Supine hip abduction; 2x10  -tactile and verbal cueing for technique  SLR attempted, unable to lift     HOME EXERCISE PROGRAM Access Code: [email or web address] URL: [email or web address] Date: 05/15/2023 Prepared by: Consuela Mimes  Exercises - Supine Quad Set  - 2 x daily - 7 x weekly - 2 sets - 10 reps - 5sec hold - Supine Heel Slide with Strap  - 2 x daily - 7 x weekly - 2 sets - 10 reps - 5sec hold - Supine Knee Extension Strengthening  - 2 x daily - 7 x weekly - 2 sets - 10 reps - Seated Long Arc Quad  - 2 x daily - 7 x weekly - 2 sets - 10 reps - Seated Knee Extension Stretch with Chair  - 2 x daily - 7 x weekly - 2 sets - 2-3 minutes hold    ASSESSMENT:   CLINICAL IMPRESSION: Patient demonstrates improving SLR and lessening quadriceps lag. He has remaining deficits with end-range knee flexion and extension. He has flexion well over 100 deg, but ROM has  not increased substantially through this week. He has minimal knee extension deficit noted today. Pt is functionally progressing very well and negotiates gym with [facility] relatively easily and has no difficulty with supine to sit or sit to stand with operative LE at this time. Pt has remaining deficits in LLE strength, limitations in L knee A/PROM, mild to moderate intermittent post-op edema, L knee pain, and gait changes. Pt will benefit from skilled PT services to address acute deficits to return to PLOF and meet all PT goals.    OBJECTIVE IMPAIRMENTS: Abnormal gait, decreased activity tolerance, decreased balance, decreased endurance, decreased knowledge of condition, decreased knowledge of use of DME, decreased mobility, difficulty walking, decreased ROM,  decreased strength, decreased safety awareness, hypomobility, increased edema, impaired flexibility, and pain.    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    REHAB POTENTIAL: Good   CLINICAL DECISION MAKING: Stable/uncomplicated   EVALUATION COMPLEXITY: Low     GOALS: Goals reviewed with patient? No   SHORT TERM GOALS: Target date: 06/21/23 Pt will be independent with HEP to improve L knee AROM and strength Baseline: 04/24/23: reviewed hospital based HEP Goal status: INITIAL   2. Pt will demo ability to complete LLE SLR without absence of quad lag.             Baseline: 04/24/23: significant quad lag            Goal status: INITIAL   LONG TERM GOALS: Target date: 06/19/23   Pt will improve FOTO to target score to demonstrate clinically significant  improvement in functional mobility. Baseline: 36 with target score of 61 Goal status: INITIAL   2.  Pt will improve L knee AROM from 0- >/= 120 to demonstrate full return to knee mobility for functional mobility tasks Baseline: 04/24/23: 6-65 AROM Goal status: INITIAL   3.  Pt will improve gait speed to >/= 1.0 m/s with LRAD to demonstrate reduced falls risk with short community distances.  Baseline: 04/24/23: Relies on RW completing .32 m/s Goal status: INITIAL   4.  Pt will improve 5xSTS to 12 seconds or less to demonstrate clinically significant improvement in LE strength  Baseline: 04/24/23: 35.38 seconds with reduced WB'ing on LLE. Goal status: INITIAL     PLAN:   PT FREQUENCY: 2x/week   PT DURATION: 8 weeks   PLANNED INTERVENTIONS: Therapeutic exercises, Therapeutic activity, Neuromuscular re-education, Balance training, Gait training, Patient/Family education, Self Care, Joint mobilization, DME instructions, Dry Needling, Electrical stimulation, Cryotherapy, Moist heat, scar mobilization, Manual therapy, and Re-evaluation   PLAN FOR NEXT SESSION: L knee ROM, quadriceps activation; progressing with closed-chain exercise as able. Advance gait with LRAD and continue with progressive weightbearing activity as tolerated.    Consuela Mimes, PT, DPT #[id number] Gertie Exon, PT 05/22/2023, 4:42 PM

## 2023-05-26 ENCOUNTER — Encounter: Payer: Self-pay | Admitting: Physical Therapy

## 2023-05-27 ENCOUNTER — Ambulatory Visit: Payer: Medicare HMO | Attending: Orthopedic Surgery | Admitting: Physical Therapy

## 2023-05-27 DIAGNOSIS — R262 Difficulty in walking, not elsewhere classified: Secondary | ICD-10-CM | POA: Diagnosis present

## 2023-05-27 DIAGNOSIS — R269 Unspecified abnormalities of gait and mobility: Secondary | ICD-10-CM

## 2023-05-27 DIAGNOSIS — M25662 Stiffness of left knee, not elsewhere classified: Secondary | ICD-10-CM

## 2023-05-27 DIAGNOSIS — M6281 Muscle weakness (generalized): Secondary | ICD-10-CM | POA: Diagnosis present

## 2023-05-27 NOTE — Therapy (Addendum)
OUTPATIENT PHYSICAL THERAPY TREATMENT NOTE   Patient Name: Ronald Whitaker MRN: [id number] DOB:1961-09-21, 62 y.o., male Today's Date: 05/27/2023   END OF SESSION:   PT End of Session - 06/02/23 0600     Visit Number 8    Number of Visits 17    Date for PT Re-Evaluation 06/19/23    PT Start Time 1645    PT Stop Time 1728    PT Time Calculation (min) 43 min    Equipment Utilized During Treatment Gait belt    Activity Tolerance Patient tolerated treatment well    Behavior During Therapy WFL for tasks assessed/performed               Past Medical History:  Diagnosis Date   Arthritis    Atrial fibrillation (HCC)    Diabetes mellitus without complication (HCC)    Gout    Hypertension    Hypothyroidism    MI, old    Thyroid disease    hypothyroid   Past Surgical History:  Procedure Laterality Date   BACK SURGERY     CARDIAC CATHETERIZATION     CATARACT EXTRACTION W/ INTRAOCULAR LENS IMPLANT Bilateral    CORONARY ARTERY BYPASS GRAFT     2014   TOTAL KNEE ARTHROPLASTY Left 04/22/2023   Procedure: TOTAL KNEE ARTHROPLASTY;  Surgeon: Durene Romans, MD;  Location: [facility];  Service: Orthopedics;  Laterality: Left;   Patient Active Problem List   Diagnosis Date Noted   S/P total knee arthroplasty, left 04/22/2023    PCP: Furin, Caryl Asp, FNP   REFERRING PROVIDER: Durene Romans, MD  REFERRING DIAG:  [phone] (ICD-10-CM) - S/P total knee arthroplasty, left    THERAPY DIAG:  Muscle weakness (generalized)  Abnormality of gait and mobility  Difficulty in walking, not elsewhere classified  Stiffness of left knee, not elsewhere classified  Rationale for Evaluation and Treatment Rehabilitation  PERTINENT HISTORY: Pt is a pleasant 62 y.o. s/p L TKA on 04/22/23. Pt denies falls since being discharged from hospital on 04/23/23 and pain is well controlled. Using RW correctly and consistently reporting spouse is helping him as needed. Excited for rehab process and return to  PLOF.   PAIN:  Are you having pain? Yes: NPRS scale: 6/10 Pain location: Anterior knee and quad Pain description: Sharp Aggravating factors: rest Relieving factors: mobility   PRECAUTIONS: Knee   WEIGHT BEARING RESTRICTIONS: Yes WBAT   FALLS:  Has patient fallen in last 6 months? No   LIVING ENVIRONMENT: Lives with: lives with their spouse Lives in: House/apartment Stairs: Yes: External: 3-4 steps; on right going up, on left going up, and can reach both Has following equipment at home: Single point cane, Walker - 2 wheeled, bed side commode, and Grab bars   OCCUPATION: Works part time   PLOF: Independent    PRECAUTIONS: S/p L TKA 04/22/23   SUBJECTIVE:  SUBJECTIVE STATEMENT:  Patient reports having to use ice a lot at night. Patient reports pain is under control with medication well if he is on schedule. Patient reports he had bad episode over the weekend during which he had lapse in Rx waiting for re-fill. Patient reports doing well with functional mobility presently.    PAIN:  Are you having pain? Yes, low-level pain at arrival    OBJECTIVE: (objective measures completed at initial evaluation unless otherwise dated)   DIAGNOSTIC FINDINGS: N/A   PATIENT SURVEYS:  FOTO 36 with target score of 61   COGNITION: Overall cognitive status: Within functional limits for tasks assessed                         SENSATION: WFL   EDEMA:  Circumferential: R: 16", L: 17.25"     POSTURE: No Significant postural limitations   PALPATION: Generally around L artificial knee joint.    LOWER EXTREMITY ROM:   Active ROM Right eval Left eval  Hip flexion > 90  > 90  Hip extension      Hip abduction WNL WNL  Hip adduction      Hip internal rotation      Hip external rotation      Knee  flexion   65 AROM/ 80 PROM  Knee extension 0 6  Ankle dorsiflexion WNL WNL  Ankle plantarflexion      Ankle inversion      Ankle eversion       (Blank rows = not tested)   LOWER EXTREMITY MMT:   MMT Right eval Left eval  Hip flexion 5 2  Hip extension      Hip abduction 5 4  Hip adduction 5 4  Hip internal rotation      Hip external rotation      Knee flexion 5 4  Knee extension 5 2  Ankle dorsiflexion 5 5  Ankle plantarflexion 5 5  Ankle inversion      Ankle eversion       (Blank rows = not tested)     FUNCTIONAL TESTS:  5 times sit to stand: 35.38 seconds 10 meter walk test: 0.32 m/s using RW   GAIT: Distance walked: 10 meters Assistive device utilized: Walker - 2 wheeled Level of assistance: Modified independence Comments: Consistent step through gait and L heel strike at initial contact. Relies on heavy BUE support on RW but good weight acceptance on LLE throughout stance phase.       TODAY'S TREATMENT:                                                                                                                              DATE: 05/27/2023     Manual Therapy - for symptom modulation, soft tissue sensitivity and mobility, joint mobility, ROM   Patellar mobilization; 2 x 30 sec ea dir, gr III for joint mobility  L knee PROM  in supine; x 10 bouts with flexion/extension as tolerated   Sitting edge of table: Tibiofemoral mobilization posterior for knee flexion; gr II-III; 2 x 30 sec bouts in sitting position L knee flexion over edge of table with 2 x 30 sec holds  AROM: -2-105 PROM: -2-111   Therapeutic Exercise - for improved soft tissue flexibility and extensibility as needed for ROM, improved strength as needed to improve performance of CKC activities/functional movements  NuStep for knee AAROM; x 5 minutes, Level 2 resistance, seat at 7   Heel slide AAROM with gait belt looped around foot; x10, 10 sec hold  -Sit to stand at 90 deg knee flexion,  with goblet hold; 6-lb Medball; 2x10   Gait with SPC in RUE following brief demo for cane placement during gait; 3 laps around gym  -pt exhibits proficient use of cane and reports using cane for short term prior to Sx  Toe tapping; 6-inch step, alternating R/L for 2x10 (staircase in center of gym)  -PT SBA  In // bars: High knees; 5 x D/B length of bars Minisquat; 2x10   Supine SLR; 2x10  -tactile cueing to maintain full knee extension   PATIENT EDUCATION: HEP review. We discussed wean of FWW over the next week and working on using [facility] for most of his household/short-distance mobility over the next week followed by gradual wean from AD completely.     *not today* // bars: Forward stepping with verbal cueing and demo for full knee extension and heel heel strike at initial contact; 4x D/B length of bars Sidestepping; 4x D/B length of bars Toe tapping on 6-inch step; 2x10 alternating Short arc quad, blue bolster under knee; therapist assist to initiate motion; 2x8 Long arc quad; edge of table; 2x8  -gentle assist to attain full knee extension  Supine hip abduction; 2x10  -tactile and verbal cueing for technique  SLR attempted, unable to lift     HOME EXERCISE PROGRAM Access Code: [email or web address] URL: [email or web address] Date: 05/15/2023 Prepared by: Consuela Mimes  Exercises - Supine Quad Set  - 2 x daily - 7 x weekly - 2 sets - 10 reps - 5sec hold - Supine Heel Slide with Strap  - 2 x daily - 7 x weekly - 2 sets - 10 reps - 5sec hold - Supine Knee Extension Strengthening  - 2 x daily - 7 x weekly - 2 sets - 10 reps - Seated Long Arc Quad  - 2 x daily - 7 x weekly - 2 sets - 10 reps - Seated Knee Extension Stretch with Chair  - 2 x daily - 7 x weekly - 2 sets - 2-3 minutes hold    ASSESSMENT:   CLINICAL IMPRESSION: Patient is minimally challenged with sit to stands or partial squats, though he does need cueing to improve symmetrical weightbearing during  squatting. He has ongoing improvement with L knee ROM, though he still has stiffness with end-range knee flexion. We are continuing to progress flexion with successive PT visits. Pt is safe with home-level ambulation with SPC versus walker at this time. Pt has remaining deficits in LLE strength, limitations in L knee A/PROM, mild to moderate intermittent post-op edema, L knee pain, and gait changes. Pt will benefit from skilled PT services to address acute deficits to return to PLOF and meet all PT goals.    OBJECTIVE IMPAIRMENTS: Abnormal gait, decreased activity tolerance, decreased balance, decreased endurance, decreased knowledge of condition, decreased knowledge of use of DME, decreased mobility, difficulty walking,  decreased ROM, decreased strength, decreased safety awareness, hypomobility, increased edema, impaired flexibility, and pain.    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    REHAB POTENTIAL: Good   CLINICAL DECISION MAKING: Stable/uncomplicated   EVALUATION COMPLEXITY: Low     GOALS: Goals reviewed with patient? No   SHORT TERM GOALS: Target date: 06/21/23 Pt will be independent with HEP to improve L knee AROM and strength Baseline: 04/24/23: reviewed hospital based HEP Goal status: INITIAL   2. Pt will demo ability to complete LLE SLR without absence of quad lag.             Baseline: 04/24/23: significant quad lag            Goal status: INITIAL   LONG TERM GOALS: Target date: 06/19/23   Pt will improve FOTO to target score to demonstrate clinically significant improvement in  functional mobility. Baseline: 36 with target score of 61 Goal status: INITIAL   2.  Pt will improve L knee AROM from 0- >/= 120 to demonstrate full return to knee mobility for functional mobility tasks Baseline: 04/24/23: 6-65 AROM Goal status: INITIAL   3.  Pt will improve gait speed to >/= 1.0 m/s with LRAD to demonstrate reduced falls risk with short community distances.  Baseline: 04/24/23: Relies on RW completing .32 m/s Goal status: INITIAL   4.  Pt will improve 5xSTS to 12 seconds or less to demonstrate clinically significant improvement in LE strength  Baseline: 04/24/23: 35.38 seconds with reduced WB'ing on LLE. Goal status: INITIAL     PLAN:   PT FREQUENCY: 2x/week   PT DURATION: 8 weeks   PLANNED INTERVENTIONS: Therapeutic exercises, Therapeutic activity, Neuromuscular re-education, Balance training, Gait training, Patient/Family education, Self Care, Joint mobilization, DME instructions, Dry Needling, Electrical stimulation, Cryotherapy, Moist heat, scar mobilization, Manual therapy, and Re-evaluation   PLAN FOR NEXT SESSION: L knee ROM, quadriceps activation; progressing with closed-chain exercise as able. Advance gait with LRAD and continue with progressive weightbearing activity as tolerated.    Consuela Mimes, PT, DPT #[id number] Gertie Exon, PT 06/02/2023, 6:01 AM

## 2023-06-02 ENCOUNTER — Ambulatory Visit: Payer: Medicare HMO | Admitting: Physical Therapy

## 2023-06-02 DIAGNOSIS — R269 Unspecified abnormalities of gait and mobility: Secondary | ICD-10-CM

## 2023-06-02 DIAGNOSIS — M6281 Muscle weakness (generalized): Secondary | ICD-10-CM | POA: Diagnosis not present

## 2023-06-02 DIAGNOSIS — M25662 Stiffness of left knee, not elsewhere classified: Secondary | ICD-10-CM

## 2023-06-02 DIAGNOSIS — R262 Difficulty in walking, not elsewhere classified: Secondary | ICD-10-CM

## 2023-06-02 NOTE — Therapy (Signed)
OUTPATIENT PHYSICAL THERAPY TREATMENT NOTE   Patient Name: Ronald Whitaker MRN: [id number] DOB:08/02/1961, 62 y.o., male Today's Date: 06/02/2023   END OF SESSION:   PT End of Session - 06/02/23 1250     Visit Number 9    Number of Visits 17    Date for PT Re-Evaluation 06/19/23    PT Start Time 1247    PT Stop Time 1331    PT Time Calculation (min) 44 min    Equipment Utilized During Treatment Gait belt    Activity Tolerance Patient tolerated treatment well    Behavior During Therapy WFL for tasks assessed/performed                Past Medical History:  Diagnosis Date   Arthritis    Atrial fibrillation (HCC)    Diabetes mellitus without complication (HCC)    Gout    Hypertension    Hypothyroidism    MI, old    Thyroid disease    hypothyroid   Past Surgical History:  Procedure Laterality Date   BACK SURGERY     CARDIAC CATHETERIZATION     CATARACT EXTRACTION W/ INTRAOCULAR LENS IMPLANT Bilateral    CORONARY ARTERY BYPASS GRAFT     2014   TOTAL KNEE ARTHROPLASTY Left 04/22/2023   Procedure: TOTAL KNEE ARTHROPLASTY;  Surgeon: Durene Romans, MD;  Location: [facility];  Service: Orthopedics;  Laterality: Left;   Patient Active Problem List   Diagnosis Date Noted   S/P total knee arthroplasty, left 04/22/2023    PCP: Furin, Caryl Asp, FNP   REFERRING PROVIDER: Durene Romans, MD  REFERRING DIAG:  [phone] (ICD-10-CM) - S/P total knee arthroplasty, left    THERAPY DIAG:  Muscle weakness (generalized)  Abnormality of gait and mobility  Difficulty in walking, not elsewhere classified  Stiffness of left knee, not elsewhere classified  Rationale for Evaluation and Treatment Rehabilitation  PERTINENT HISTORY: Pt is a pleasant 62 y.o. s/p L TKA on 04/22/23. Pt denies falls since being discharged from hospital on 04/23/23 and pain is well controlled. Using RW correctly and consistently reporting spouse is helping him as needed. Excited for rehab process and return  to PLOF.   PAIN:  Are you having pain? Yes: NPRS scale: 6/10 Pain location: Anterior knee and quad Pain description: Sharp Aggravating factors: rest Relieving factors: mobility   PRECAUTIONS: Knee   WEIGHT BEARING RESTRICTIONS: Yes WBAT   FALLS:  Has patient fallen in last 6 months? No   LIVING ENVIRONMENT: Lives with: lives with their spouse Lives in: House/apartment Stairs: Yes: External: 3-4 steps; on right going up, on left going up, and can reach both Has following equipment at home: Single point cane, Walker - 2 wheeled, bed side commode, and Grab bars   OCCUPATION: Works part time   PLOF: Independent    PRECAUTIONS: S/p L TKA 04/22/23   SUBJECTIVE:  SUBJECTIVE STATEMENT:  Patient reports some discomfort with lying with his L knee at night. He reports doing well with getting around at this time with his LLE.    PAIN:  Are you having pain? Yes, low-level pain at arrival    OBJECTIVE: (objective measures completed at initial evaluation unless otherwise dated)   DIAGNOSTIC FINDINGS: N/A   PATIENT SURVEYS:  FOTO 36 with target score of 61   COGNITION: Overall cognitive status: Within functional limits for tasks assessed                         SENSATION: WFL   EDEMA:  Circumferential: R: 16", L: 17.25"     POSTURE: No Significant postural limitations   PALPATION: Generally around L artificial knee joint.    LOWER EXTREMITY ROM:   Active ROM Right eval Left eval  Hip flexion > 90  > 90  Hip extension      Hip abduction WNL WNL  Hip adduction      Hip internal rotation      Hip external rotation      Knee flexion   65 AROM/ 80 PROM  Knee extension 0 6  Ankle dorsiflexion WNL WNL  Ankle plantarflexion      Ankle inversion      Ankle eversion       (Blank rows =  not tested)   LOWER EXTREMITY MMT:   MMT Right eval Left eval  Hip flexion 5 2  Hip extension      Hip abduction 5 4  Hip adduction 5 4  Hip internal rotation      Hip external rotation      Knee flexion 5 4  Knee extension 5 2  Ankle dorsiflexion 5 5  Ankle plantarflexion 5 5  Ankle inversion      Ankle eversion       (Blank rows = not tested)     FUNCTIONAL TESTS:  5 times sit to stand: 35.38 seconds 10 meter walk test: 0.32 m/s using RW   GAIT: Distance walked: 10 meters Assistive device utilized: Walker - 2 wheeled Level of assistance: Modified independence Comments: Consistent step through gait and L heel strike at initial contact. Relies on heavy BUE support on RW but good weight acceptance on LLE throughout stance phase.       TODAY'S TREATMENT:                                                                                                                              DATE: 06/02/2023     Manual Therapy - for symptom modulation, soft tissue sensitivity and mobility, joint mobility, ROM   Patellar mobilization; 2 x 30 sec ea dir, gr III for joint mobility  L knee PROM in supine; x 10 bouts with flexion/extension as tolerated   Sitting edge of table: Tibiofemoral mobilization posterior for knee flexion; gr II-III; 2 x 30  sec bouts in sitting position L knee flexion over edge of table with 2 x 30 sec holds  AROM: -2-105 PROM: -2-115   Therapeutic Exercise - for improved soft tissue flexibility and extensibility as needed for ROM, improved strength as needed to improve performance of CKC activities/functional movements  NuStep for knee AAROM; x 5 minutes, Level  resistance, seat at 7   Heel slide AAROM with gait belt looped around foot; x10, 10 sec hold  -Sit to stand at 90 deg knee flexion, with goblet hold; 6-lb Medball; 2x10  -cued patient for ensuring proper foot placement, avoiding RLE posterior   Gait with SPC in RUE following brief demo for cane  placement during gait; 3 laps around gym  -pt exhibits proficient use of cane and reports using cane for short term prior to Sx  Toe tapping; 6-inch step, alternating R/L for 2x10 (staircase in center of gym)  -PT SBA   In // bars: High knees; 5 x D/B length of bars    PATIENT EDUCATION: HEP review.      *not today* Minisquat; 2x10 Supine SLR; 2x10 // bars: Forward stepping with verbal cueing and demo for full knee extension and heel heel strike at initial contact; 4x D/B length of bars Sidestepping; 4x D/B length of bars Toe tapping on 6-inch step; 2x10 alternating Short arc quad, blue bolster under knee; therapist assist to initiate motion; 2x8 Long arc quad; edge of table; 2x8  -gentle assist to attain full knee extension  Supine hip abduction; 2x10  -tactile and verbal cueing for technique  SLR attempted, unable to lift     HOME EXERCISE PROGRAM Access Code: [email or web address] URL: [email or web address] Date: 05/15/2023 Prepared by: Consuela Mimes  Exercises - Supine Quad Set  - 2 x daily - 7 x weekly - 2 sets - 10 reps - 5sec hold - Supine Heel Slide with Strap  - 2 x daily - 7 x weekly - 2 sets - 10 reps - 5sec hold - Supine Knee Extension Strengthening  - 2 x daily - 7 x weekly - 2 sets - 10 reps - Seated Long Arc Quad  - 2 x daily - 7 x weekly - 2 sets - 10 reps - Seated Knee Extension Stretch with Chair  - 2 x daily - 7 x weekly - 2 sets - 2-3 minutes hold    ASSESSMENT:   CLINICAL IMPRESSION: Patient is progressing appropriately with knee flexion, up to 115 deg today. He has terminal extension grossly WFL and is progressing well with weaning from AD. He is able to negotiate gym with no AD and can use his cane for most functional mobility at this time. Pt is due for re-assessment/progress note next visit. Pt has remaining deficits in LLE strength, limitations in L knee A/PROM, mild to moderate intermittent post-op edema, L knee pain, and gait changes.  Pt will benefit from skilled PT services to address acute deficits to return to PLOF and meet all PT goals.    OBJECTIVE IMPAIRMENTS: Abnormal gait, decreased activity tolerance, decreased balance, decreased endurance, decreased knowledge of condition, decreased knowledge of use of DME, decreased mobility, difficulty walking, decreased ROM, decreased strength, decreased safety awareness, hypomobility, increased edema, impaired flexibility, and pain.    ACTIVITY LIMITATIONS: carrying, lifting, standing, squatting, and locomotion level   PARTICIPATION LIMITATIONS: laundry, driving, shopping, community activity, occupation, and yard work   PERSONAL FACTORS: Age, Education, Fitness, Past/current experiences, Profession, Time since onset of injury/illness/exacerbation, and 3+ comorbidities: Hx of HTN,  A-fib, CABG, DM , hypothyroidism   are also affecting patient's functional outcome.    REHAB POTENTIAL: Good   CLINICAL DECISION MAKING: Stable/uncomplicated   EVALUATION COMPLEXITY: Low     GOALS: Goals reviewed with patient? No   SHORT TERM GOALS: Target date: 06/21/23 Pt will be independent with HEP to improve L knee AROM and strength Baseline: 04/24/23: reviewed hospital based HEP Goal status: INITIAL   2. Pt will demo ability to complete LLE SLR without absence of quad lag.             Baseline: 04/24/23: significant quad lag            Goal status: INITIAL   LONG TERM GOALS: Target date: 06/19/23   Pt will improve FOTO to target score to demonstrate clinically significant improvement in functional mobility. Baseline: 36 with target score of 61 Goal status: INITIAL   2.  Pt will improve L knee AROM from 0- >/= 120 to demonstrate full return to knee mobility for functional mobility tasks Baseline: 04/24/23: 6-65 AROM Goal status: INITIAL   3.  Pt will improve gait speed to >/= 1.0 m/s with LRAD to demonstrate reduced falls risk with short community distances.  Baseline: 04/24/23: Relies  on RW completing .32 m/s Goal status: INITIAL   4.  Pt will improve 5xSTS to 12 seconds or less to demonstrate clinically significant improvement in LE strength  Baseline: 04/24/23: 35.38 seconds with reduced WB'ing on LLE. Goal status: INITIAL     PLAN:   PT FREQUENCY: 2x/week   PT DURATION: 8 weeks   PLANNED INTERVENTIONS: Therapeutic exercises, Therapeutic activity, Neuromuscular re-education, Balance training, Gait training, Patient/Family education, Self Care, Joint mobilization, DME instructions, Dry Needling, Electrical stimulation, Cryotherapy, Moist heat, scar mobilization, Manual therapy, and Re-evaluation   PLAN FOR NEXT SESSION: L knee ROM, quadriceps activation; progressing with closed-chain exercise as able. Advance gait with LRAD and continue with progressive weightbearing activity as tolerated.  Progress note next visit.  Consuela Mimes, PT, DPT #[id number] Gertie Exon, PT 06/02/2023, 1:31 PM

## 2023-06-03 ENCOUNTER — Ambulatory Visit: Payer: Medicare HMO | Admitting: Physical Therapy

## 2023-06-04 ENCOUNTER — Ambulatory Visit: Payer: Medicare HMO | Admitting: Physical Therapy

## 2023-06-04 DIAGNOSIS — R269 Unspecified abnormalities of gait and mobility: Secondary | ICD-10-CM

## 2023-06-04 DIAGNOSIS — M6281 Muscle weakness (generalized): Secondary | ICD-10-CM

## 2023-06-04 DIAGNOSIS — M25662 Stiffness of left knee, not elsewhere classified: Secondary | ICD-10-CM

## 2023-06-04 DIAGNOSIS — R262 Difficulty in walking, not elsewhere classified: Secondary | ICD-10-CM

## 2023-06-04 NOTE — Therapy (Signed)
OUTPATIENT PHYSICAL THERAPY TREATMENT AND PROGRESS NOTE   Dates of reporting period  04/24/23   to   06/04/23   Patient Name: Ronald Whitaker MRN: [id number] DOB:[birth year], 62 y.o., male Today's Date: 06/04/2023   END OF SESSION:   PT End of Session - 06/04/23 1245     Visit Number 10    Number of Visits 17    Date for PT Re-Evaluation 06/19/23    PT Start Time 1245    PT Stop Time 1328    PT Time Calculation (min) 43 min    Equipment Utilized During Treatment Gait belt    Activity Tolerance Patient tolerated treatment well    Behavior During Therapy WFL for tasks assessed/performed              Past Medical History:  Diagnosis Date   Arthritis    Atrial fibrillation (HCC)    Diabetes mellitus without complication (HCC)    Gout    Hypertension    Hypothyroidism    MI, old    Thyroid disease    hypothyroid   Past Surgical History:  Procedure Laterality Date   BACK SURGERY     CARDIAC CATHETERIZATION     CATARACT EXTRACTION W/ INTRAOCULAR LENS IMPLANT Bilateral    CORONARY ARTERY BYPASS GRAFT     2014   TOTAL KNEE ARTHROPLASTY Left 04/22/2023   Procedure: TOTAL KNEE ARTHROPLASTY;  Surgeon: Durene Romans, MD;  Location: [facility];  Service: Orthopedics;  Laterality: Left;   Patient Active Problem List   Diagnosis Date Noted   S/P total knee arthroplasty, left 04/22/2023    PCP: Furin, Caryl Asp, FNP   REFERRING PROVIDER: Durene Romans, MD  REFERRING DIAG:  [phone] (ICD-10-CM) - S/P total knee arthroplasty, left    THERAPY DIAG:  Muscle weakness (generalized)  Abnormality of gait and mobility  Difficulty in walking, not elsewhere classified  Stiffness of left knee, not elsewhere classified  Rationale for Evaluation and Treatment Rehabilitation  PERTINENT HISTORY: Pt is a pleasant 62 y.o. s/p L TKA on 04/22/23. Pt denies falls since being discharged from hospital on 04/23/23 and pain is well controlled. Using RW correctly and consistently reporting  spouse is helping him as needed. Excited for rehab process and return to PLOF.   PAIN:  Are you having pain? Yes: NPRS scale: 6/10 Pain location: Anterior knee and quad Pain description: Sharp Aggravating factors: rest Relieving factors: mobility   PRECAUTIONS: Knee   WEIGHT BEARING RESTRICTIONS: Yes WBAT   FALLS:  Has patient fallen in last 6 months? No   LIVING ENVIRONMENT: Lives with: lives with their spouse Lives in: House/apartment Stairs: Yes: External: 3-4 steps; on right going up, on left going up, and can reach both Has following equipment at home: Single point cane, Walker - 2 wheeled, bed side commode, and Grab bars   OCCUPATION: Works part time   PLOF: Independent    PRECAUTIONS: S/p L TKA 04/22/23   SUBJECTIVE:  SUBJECTIVE STATEMENT:  Patient reports having minimal pain at arrival. He reports having more pain at night in particular. Patient reports using cold compression machine in evening for swelling. He is partially compliant with HEP. Patient reports attempting to use pillow between knees, but he reports discomfort with knees being stacked after 30 mins. Patient reports getting better with community-level mobility; pt using SPC to get to appointments. He reports he needs improved stamina with walking. Patient reports his transfers are getting better - improved stability after completing transfer. 85% global rating of function at this time.    PAIN:  Are you having pain? Yes, low-level pain at arrival    OBJECTIVE: (objective measures completed at initial evaluation unless otherwise dated)    LOWER EXTREMITY ROM:   Active ROM Right eval Left eval Right 06/04/23 Left 06/04/23  Hip flexion > 90  > 90    Hip extension        Hip abduction WNL WNL    Hip adduction        Hip  internal rotation        Hip external rotation        Knee flexion   65 AROM/ 80 PROM  101 AROM/111 PROM  Knee extension 0 6  -4  Ankle dorsiflexion WNL WNL    Ankle plantarflexion        Ankle inversion        Ankle eversion         (Blank rows = not tested)     06/04/23: L knee PROM -2-0-111  LOWER EXTREMITY MMT:   MMT Right eval Left eval Right 06/04/23 Left 06/04/23  Hip flexion 5 2  4-  Hip extension        Hip abduction 5 4  5  (seated)  Hip adduction 5 4  5  (seated)   Hip internal rotation        Hip external rotation        Knee flexion 5 4  5   Knee extension 5 2  4   Ankle dorsiflexion 5 5    Ankle plantarflexion 5 5    Ankle inversion        Ankle eversion         (Blank rows = not tested)     FUNCTIONAL TESTS (initial eval):  5 times sit to stand: 35.38 seconds 10 meter walk test: 0.32 m/s using RW    GAIT: Distance walked: 10 meters Assistive device utilized: Walker - 2 wheeled Level of assistance: Modified independence Comments: Consistent step through gait and L heel strike at initial contact. Relies on heavy BUE support on RW but good weight acceptance on LLE throughout stance phase.   06/04/23: No AD. Mild decreased TKE at terminal swing with LLE. Mild dec stance time on LLE during stance phase. Good LLE weight shift.       TODAY'S TREATMENT:  DATE: 06/04/2023     Manual Therapy - for symptom modulation, soft tissue sensitivity and mobility, joint mobility, ROM   Patellar mobilization; 2 x 30 sec ea dir, gr III for joint mobility  L knee PROM in supine; x 10 bouts with flexion/extension as tolerated   Sitting edge of table: Tibiofemoral mobilization posterior for knee flexion; gr II-III; 2 x 30 sec bouts in sitting position L knee flexion over edge of table with 2 x 30 sec holds    Therapeutic Exercise - for improved  soft tissue flexibility and extensibility as needed for ROM, improved strength as needed to improve performance of CKC activities/functional movements   *GOAL UPDATE PERFORMED (see Goal section below)   NuStep for knee AAROM; x 5 minutes, Level 3 resistance, seat at 7    In // bars: High knees; 5 x D/B length of bars  Standing TKE with Green Tband; 2x10, 5 sec    PATIENT EDUCATION: Discussed current progress in PT, prognosis, continued POC and anticipated d/c time.     *next visit* -Sit to stand at 90 deg knee flexion, with goblet hold; 6-lb Medball; 2x10  -cued patient for ensuring proper foot placement, avoiding RLE posterior Toe tapping; 6-inch step, alternating R/L for 2x10 (staircase in center of gym)  -PT SBA   *not today* Gait with SPC Heel slide AAROM with gait belt looped around foot; x10, 10 sec hold Minisquat; 2x10 Supine SLR; 2x10 // bars: Forward stepping with verbal cueing and demo for full knee extension and heel heel strike at initial contact; 4x D/B length of bars Sidestepping; 4x D/B length of bars Toe tapping on 6-inch step; 2x10 alternating Short arc quad, blue bolster under knee; therapist assist to initiate motion; 2x8 Long arc quad; edge of table; 2x8  -gentle assist to attain full knee extension  Supine hip abduction; 2x10  -tactile and verbal cueing for technique  SLR attempted, unable to lift     HOME EXERCISE PROGRAM Access Code: [email or web address] URL: [email or web address] Date: 05/15/2023 Prepared by: Consuela Mimes  Exercises - Supine Quad Set  - 2 x daily - 7 x weekly - 2 sets - 10 reps - 5sec hold - Supine Heel Slide with Strap  - 2 x daily - 7 x weekly - 2 sets - 10 reps - 5sec hold - Supine Knee Extension Strengthening  - 2 x daily - 7 x weekly - 2 sets - 10 reps - Seated Long Arc Quad  - 2 x daily - 7 x weekly - 2 sets - 10 reps - Seated Knee Extension Stretch with Chair  - 2 x daily - 7 x weekly - 2 sets - 2-3 minutes  hold    ASSESSMENT:   CLINICAL IMPRESSION: Patient is continuing to progress with knee ROM. We have attained L knee flexion PROM up to 115 degrees; only up to 101 deg actively. Pt has weaned from use of walker and uses [facility] for community-level ambulation and seldom uses AD inside of home. Patient has made progress with FOTO score, but has not yet met predicted outcome score. He has met goal for 5TSTS and gait speed established at initial eval. Pt is making great progress functionally and can readily perform sit to stand from low table and chairs in office. He demonstrates safe gait in gym without AD. Pt has remaining deficits in LLE strength, limitations in L knee A/PROM, mild to moderate intermittent post-op edema, and intermittent L knee pain. Pt will benefit from continued skilled  PT services to address acute deficits to return to PLOF and meet all PT goals.    OBJECTIVE IMPAIRMENTS: Abnormal gait, decreased activity tolerance, decreased balance, decreased endurance, decreased knowledge of condition, decreased knowledge of use of DME, decreased mobility, difficulty walking, decreased ROM, decreased strength, decreased safety awareness, hypomobility, increased edema, impaired flexibility, and pain.    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    REHAB POTENTIAL: Good   CLINICAL DECISION MAKING: Stable/uncomplicated   EVALUATION COMPLEXITY: Low     GOALS: Goals reviewed with patient? No   SHORT TERM GOALS: Target date: 06/21/23 Pt will be independent with HEP to improve L knee AROM and  strength Baseline: 04/24/23: reviewed hospital based HEP 06/04/23: Pt partially compliant with updated HEP.  Goal status: PARTIALLY MET   2. Pt will demo ability to complete LLE SLR without absence of quad lag.             Baseline: 04/24/23: significant quad lag.   06/04/23: Performed without quad lag  06/04/23: Performed without quad lag and pt maintaining knee extension.            Goal status: ACHIEVED   LONG TERM GOALS: Target date: 06/19/23   Pt will improve FOTO to target score to demonstrate clinically significant improvement in functional mobility. Baseline: 36 with target score of 61 06/04/23: 49/61 Goal status: IN PROGRESS   2.  Pt will improve L knee AROM from 0- >/= 120 to demonstrate full return to knee mobility for functional mobility tasks Baseline: 04/24/23: 6-65 AROM 06/04/23: -4-101 AROM Goal status: IN PROGRESS   3.  Pt will improve gait speed to >/= 1.0 m/s with LRAD to demonstrate reduced falls risk with short community distances.  Baseline: 04/24/23: Relies on RW completing 0.32 m/s 06/04/23: 1.05 m/s Goal status: ACHIEVED    4.  Pt will improve 5xSTS to 12 seconds or less to demonstrate clinically significant improvement in LE strength  Baseline: 04/24/23: 35.38 seconds with reduced WB'ing on LLE.  06/04/23:  10.7 sec Goal status: ACHIEVED      PLAN:   PT FREQUENCY: 1-2x/week   PT DURATION: 3-4 weeks   PLANNED INTERVENTIONS: Therapeutic exercises, Therapeutic activity, Neuromuscular re-education, Balance training, Gait training, Patient/Family education, Self Care, Joint mobilization, DME instructions, Dry Needling, Electrical stimulation, Cryotherapy, Moist heat, scar mobilization, Manual therapy, and Re-evaluation   PLAN FOR NEXT SESSION: L knee ROM, quadriceps activation; progressing with closed-chain exercise as able. Advance gait with LRAD and continue with progressive weightbearing activity as tolerated.   Consuela Mimes, PT, DPT #[id number] Gertie Exon, PT 06/04/2023, 12:45 PM

## 2023-06-05 ENCOUNTER — Encounter: Payer: Medicare HMO | Admitting: Physical Therapy

## 2023-06-07 ENCOUNTER — Encounter: Payer: Self-pay | Admitting: Physical Therapy

## 2023-06-10 ENCOUNTER — Encounter: Payer: Medicare HMO | Admitting: Physical Therapy

## 2023-06-12 ENCOUNTER — Encounter: Payer: Medicare HMO | Admitting: Physical Therapy

## 2023-06-16 ENCOUNTER — Ambulatory Visit: Payer: Medicare HMO | Admitting: Physical Therapy

## 2023-06-16 NOTE — Therapy (Deleted)
OUTPATIENT PHYSICAL THERAPY TREATMENT    Patient Name: Ronald Whitaker MRN: [id number] DOB:1961/06/17, 62 y.o., male Today's Date: 06/16/2023   END OF SESSION:      Past Medical History:  Diagnosis Date   Arthritis    Atrial fibrillation (HCC)    Diabetes mellitus without complication (HCC)    Gout    Hypertension    Hypothyroidism    MI, old    Thyroid disease    hypothyroid   Past Surgical History:  Procedure Laterality Date   BACK SURGERY     CARDIAC CATHETERIZATION     CATARACT EXTRACTION W/ INTRAOCULAR LENS IMPLANT Bilateral    CORONARY ARTERY BYPASS GRAFT     2014   TOTAL KNEE ARTHROPLASTY Left 04/22/2023   Procedure: TOTAL KNEE ARTHROPLASTY;  Surgeon: Durene Romans, MD;  Location: [facility];  Service: Orthopedics;  Laterality: Left;   Patient Active Problem List   Diagnosis Date Noted   S/P total knee arthroplasty, left 04/22/2023    PCP: Furin, Caryl Asp, FNP   REFERRING PROVIDER: Durene Romans, MD  REFERRING DIAG:  [phone] (ICD-10-CM) - S/P total knee arthroplasty, left    THERAPY DIAG:  Muscle weakness (generalized)  Abnormality of gait and mobility  Difficulty in walking, not elsewhere classified  Stiffness of left knee, not elsewhere classified  Rationale for Evaluation and Treatment Rehabilitation  PERTINENT HISTORY: Pt is a pleasant 62 y.o. s/p L TKA on 04/22/23. Pt denies falls since being discharged from hospital on 04/23/23 and pain is well controlled. Using RW correctly and consistently reporting spouse is helping him as needed. Excited for rehab process and return to PLOF.   PAIN:  Are you having pain? Yes: NPRS scale: 6/10 Pain location: Anterior knee and quad Pain description: Sharp Aggravating factors: rest Relieving factors: mobility   PRECAUTIONS: Knee   WEIGHT BEARING RESTRICTIONS: Yes WBAT   FALLS:  Has patient fallen in last 6 months? No   LIVING ENVIRONMENT: Lives with: lives with their spouse Lives in:  House/apartment Stairs: Yes: External: 3-4 steps; on right going up, on left going up, and can reach both Has following equipment at home: Single point cane, Walker - 2 wheeled, bed side commode, and Grab bars   OCCUPATION: Works part time   PLOF: Independent    PRECAUTIONS: S/p L TKA 04/22/23   SUBJECTIVE:                                                                                                                                                                                      SUBJECTIVE STATEMENT:  Patient reports having minimal pain at arrival. He reports having more pain at night in  particular. Patient reports using cold compression machine in evening for swelling. He is partially compliant with HEP. Patient reports attempting to use pillow between knees, but he reports discomfort with knees being stacked after 30 mins. Patient reports getting better with community-level mobility; pt using SPC to get to appointments. He reports he needs improved stamina with walking. Patient reports his transfers are getting better - improved stability after completing transfer. 85% global rating of function at this time.    PAIN:  Are you having pain? Yes, low-level pain at arrival    OBJECTIVE: (objective measures completed at initial evaluation unless otherwise dated)    LOWER EXTREMITY ROM:   Active ROM Right eval Left eval Right 06/04/23 Left 06/04/23  Hip flexion > 90  > 90    Hip extension        Hip abduction WNL WNL    Hip adduction        Hip internal rotation        Hip external rotation        Knee flexion   65 AROM/ 80 PROM  101 AROM/111 PROM  Knee extension 0 6  -4  Ankle dorsiflexion WNL WNL    Ankle plantarflexion        Ankle inversion        Ankle eversion         (Blank rows = not tested)     06/04/23: L knee PROM -2-0-111  LOWER EXTREMITY MMT:   MMT Right eval Left eval Right 06/04/23 Left 06/04/23  Hip flexion 5 2  4-  Hip extension        Hip  abduction 5 4  5  (seated)  Hip adduction 5 4  5  (seated)   Hip internal rotation        Hip external rotation        Knee flexion 5 4  5   Knee extension 5 2  4   Ankle dorsiflexion 5 5    Ankle plantarflexion 5 5    Ankle inversion        Ankle eversion         (Blank rows = not tested)     FUNCTIONAL TESTS (initial eval):  5 times sit to stand: 35.38 seconds 10 meter walk test: 0.32 m/s using RW    GAIT: Distance walked: 10 meters Assistive device utilized: Walker - 2 wheeled Level of assistance: Modified independence Comments: Consistent step through gait and L heel strike at initial contact. Relies on heavy BUE support on RW but good weight acceptance on LLE throughout stance phase.   06/04/23: No AD. Mild decreased TKE at terminal swing with LLE. Mild dec stance time on LLE during stance phase. Good LLE weight shift.       TODAY'S TREATMENT:  DATE: 06/16/2023     Manual Therapy - for symptom modulation, soft tissue sensitivity and mobility, joint mobility, ROM   Patellar mobilization; 2 x 30 sec ea dir, gr III for joint mobility  L knee PROM in supine; x 10 bouts with flexion/extension as tolerated   Sitting edge of table: Tibiofemoral mobilization posterior for knee flexion; gr II-III; 2 x 30 sec bouts in sitting position L knee flexion over edge of table with 2 x 30 sec holds    Therapeutic Exercise - for improved soft tissue flexibility and extensibility as needed for ROM, improved strength as needed to improve performance of CKC activities/functional movements   NuStep for knee AAROM; x 5 minutes, Level 3 resistance, seat at 7    In // bars: High knees; 5 x D/B length of bars  Standing TKE with Green Tband; 2x10, 5 sec   -Sit to stand at 90 deg knee flexion, with goblet hold; 6-lb Medball; 2x10  -cued patient for ensuring proper  foot placement, avoiding RLE posterior Toe tapping; 6-inch step, alternating R/L for 2x10 (staircase in center of gym)  -PT SBA   PATIENT EDUCATION: Discussed current progress in PT, prognosis, continued POC and anticipated d/c time.     *not today* Gait with SPC Heel slide AAROM with gait belt looped around foot; x10, 10 sec hold Minisquat; 2x10 Supine SLR; 2x10 // bars: Forward stepping with verbal cueing and demo for full knee extension and heel heel strike at initial contact; 4x D/B length of bars Sidestepping; 4x D/B length of bars Toe tapping on 6-inch step; 2x10 alternating Short arc quad, blue bolster under knee; therapist assist to initiate motion; 2x8 Long arc quad; edge of table; 2x8  -gentle assist to attain full knee extension  Supine hip abduction; 2x10  -tactile and verbal cueing for technique  SLR attempted, unable to lift     HOME EXERCISE PROGRAM Access Code: [email or web address] URL: [email or web address] Date: 05/15/2023 Prepared by: Consuela Mimes  Exercises - Supine Quad Set  - 2 x daily - 7 x weekly - 2 sets - 10 reps - 5sec hold - Supine Heel Slide with Strap  - 2 x daily - 7 x weekly - 2 sets - 10 reps - 5sec hold - Supine Knee Extension Strengthening  - 2 x daily - 7 x weekly - 2 sets - 10 reps - Seated Long Arc Quad  - 2 x daily - 7 x weekly - 2 sets - 10 reps - Seated Knee Extension Stretch with Chair  - 2 x daily - 7 x weekly - 2 sets - 2-3 minutes hold    ASSESSMENT:   CLINICAL IMPRESSION: Patient is continuing to progress with knee ROM. We have attained L knee flexion PROM up to 115 degrees; only up to 101 deg actively. Pt has weaned from use of walker and uses [facility] for community-level ambulation and seldom uses AD inside of home. Patient has made progress with FOTO score, but has not yet met predicted outcome score. He has met goal for 5TSTS and gait speed established at initial eval. Pt is making great progress functionally and can  readily perform sit to stand from low table and chairs in office. He demonstrates safe gait in gym without AD. Pt has remaining deficits in LLE strength, limitations in L knee A/PROM, mild to moderate intermittent post-op edema, and intermittent L knee pain. Pt will benefit from continued skilled PT services to address acute deficits to return to PLOF and meet  all PT goals.    OBJECTIVE IMPAIRMENTS: Abnormal gait, decreased activity tolerance, decreased balance, decreased endurance, decreased knowledge of condition, decreased knowledge of use of DME, decreased mobility, difficulty walking, decreased ROM, decreased strength, decreased safety awareness, hypomobility, increased edema, impaired flexibility, and pain.    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    REHAB POTENTIAL: Good   CLINICAL DECISION MAKING: Stable/uncomplicated   EVALUATION COMPLEXITY: Low     GOALS: Goals reviewed with patient? No   SHORT TERM GOALS: Target date: 06/21/23 Pt will be independent with HEP to improve L knee AROM and strength Baseline: 04/24/23: reviewed hospital based HEP 06/04/23: Pt partially compliant with updated HEP.  Goal status: PARTIALLY MET   2. Pt will demo ability to complete LLE SLR without absence of quad lag.             Baseline: 04/24/23: significant quad lag.   06/04/23: Performed without quad lag  06/04/23: Performed without quad lag and pt maintaining knee extension.            Goal status: ACHIEVED   LONG TERM GOALS: Target date: 06/19/23   Pt will improve FOTO to target score to demonstrate clinically  significant improvement in functional mobility. Baseline: 36 with target score of 61 06/04/23: 49/61 Goal status: IN PROGRESS   2.  Pt will improve L knee AROM from 0- >/= 120 to demonstrate full return to knee mobility for functional mobility tasks Baseline: 04/24/23: 6-65 AROM 06/04/23: -4-101 AROM Goal status: IN PROGRESS   3.  Pt will improve gait speed to >/= 1.0 m/s with LRAD to demonstrate reduced falls risk with short community distances.  Baseline: 04/24/23: Relies on RW completing 0.32 m/s 06/04/23: 1.05 m/s Goal status: ACHIEVED    4.  Pt will improve 5xSTS to 12 seconds or less to demonstrate clinically significant improvement in LE strength  Baseline: 04/24/23: 35.38 seconds with reduced WB'ing on LLE.  06/04/23:  10.7 sec Goal status: ACHIEVED      PLAN:   PT FREQUENCY: 1-2x/week   PT DURATION: 3-4 weeks   PLANNED INTERVENTIONS: Therapeutic exercises, Therapeutic activity, Neuromuscular re-education, Balance training, Gait training, Patient/Family education, Self Care, Joint mobilization, DME instructions, Dry Needling, Electrical stimulation, Cryotherapy, Moist heat, scar mobilization, Manual therapy, and Re-evaluation   PLAN FOR NEXT SESSION: L knee ROM, quadriceps activation; progressing with closed-chain exercise as able. Advance gait with LRAD and continue with progressive weightbearing activity as tolerated.   Consuela Mimes, PT, DPT #[id number] Gertie Exon, PT 06/16/2023, 10:24 AM

## 2023-06-18 ENCOUNTER — Ambulatory Visit: Payer: Medicare HMO | Admitting: Physical Therapy
